# Patient Record
Sex: Female | Born: 1960 | Race: White | Hispanic: No | Marital: Married | Smoking: Former smoker
Health system: Southern US, Community
[De-identification: ages and names within clinical notes are randomized; demographics above are authoritative.]

## PROBLEM LIST (undated history)

## (undated) ENCOUNTER — Ambulatory Visit: Admission: EM | Payer: Self-pay | Source: Home / Self Care

## (undated) DIAGNOSIS — K589 Irritable bowel syndrome without diarrhea: Secondary | ICD-10-CM

## (undated) DIAGNOSIS — M1990 Unspecified osteoarthritis, unspecified site: Secondary | ICD-10-CM

## (undated) DIAGNOSIS — K219 Gastro-esophageal reflux disease without esophagitis: Secondary | ICD-10-CM

## (undated) DIAGNOSIS — Z8614 Personal history of Methicillin resistant Staphylococcus aureus infection: Secondary | ICD-10-CM

## (undated) DIAGNOSIS — F419 Anxiety disorder, unspecified: Secondary | ICD-10-CM

## (undated) DIAGNOSIS — K529 Noninfective gastroenteritis and colitis, unspecified: Secondary | ICD-10-CM

## (undated) DIAGNOSIS — D649 Anemia, unspecified: Secondary | ICD-10-CM

## (undated) DIAGNOSIS — R0600 Dyspnea, unspecified: Secondary | ICD-10-CM

## (undated) DIAGNOSIS — C50919 Malignant neoplasm of unspecified site of unspecified female breast: Secondary | ICD-10-CM

## (undated) DIAGNOSIS — T7840XA Allergy, unspecified, initial encounter: Secondary | ICD-10-CM

## (undated) DIAGNOSIS — R011 Cardiac murmur, unspecified: Secondary | ICD-10-CM

## (undated) DIAGNOSIS — J189 Pneumonia, unspecified organism: Secondary | ICD-10-CM

## (undated) DIAGNOSIS — Z923 Personal history of irradiation: Secondary | ICD-10-CM

## (undated) HISTORY — PX: COLONOSCOPY W/ POLYPECTOMY: SHX1380

## (undated) HISTORY — PX: FOOT SURGERY: SHX648

## (undated) HISTORY — PX: BREAST CYST EXCISION: SHX579

## (undated) HISTORY — PX: CHOLECYSTECTOMY: SHX55

## (undated) HISTORY — PX: COLONOSCOPY WITH ESOPHAGOGASTRODUODENOSCOPY (EGD): SHX5779

---

## 1993-08-31 HISTORY — PX: TUBAL LIGATION: SHX77

## 2001-08-31 DIAGNOSIS — J189 Pneumonia, unspecified organism: Secondary | ICD-10-CM

## 2001-08-31 HISTORY — DX: Pneumonia, unspecified organism: J18.9

## 2004-06-19 ENCOUNTER — Ambulatory Visit: Payer: Self-pay

## 2007-01-13 ENCOUNTER — Ambulatory Visit: Payer: Self-pay

## 2008-01-16 ENCOUNTER — Ambulatory Visit: Payer: Self-pay

## 2008-12-12 ENCOUNTER — Ambulatory Visit: Payer: Self-pay | Admitting: Gastroenterology

## 2009-01-25 ENCOUNTER — Ambulatory Visit: Payer: Self-pay | Admitting: Surgery

## 2009-01-30 ENCOUNTER — Ambulatory Visit: Payer: Self-pay | Admitting: Surgery

## 2010-09-25 ENCOUNTER — Ambulatory Visit: Payer: Self-pay

## 2011-09-01 DIAGNOSIS — Z8614 Personal history of Methicillin resistant Staphylococcus aureus infection: Secondary | ICD-10-CM

## 2011-09-01 HISTORY — DX: Personal history of Methicillin resistant Staphylococcus aureus infection: Z86.14

## 2012-01-12 ENCOUNTER — Ambulatory Visit: Payer: Self-pay

## 2012-08-26 ENCOUNTER — Ambulatory Visit: Payer: Self-pay | Admitting: Internal Medicine

## 2013-01-20 ENCOUNTER — Ambulatory Visit: Payer: Self-pay | Admitting: Internal Medicine

## 2013-10-31 ENCOUNTER — Ambulatory Visit: Payer: Self-pay | Admitting: Family Medicine

## 2014-01-23 ENCOUNTER — Ambulatory Visit: Payer: Self-pay | Admitting: Gastroenterology

## 2014-01-24 LAB — PATHOLOGY REPORT

## 2014-01-30 DIAGNOSIS — K219 Gastro-esophageal reflux disease without esophagitis: Secondary | ICD-10-CM | POA: Insufficient documentation

## 2014-01-30 DIAGNOSIS — J309 Allergic rhinitis, unspecified: Secondary | ICD-10-CM | POA: Insufficient documentation

## 2014-01-30 DIAGNOSIS — K589 Irritable bowel syndrome without diarrhea: Secondary | ICD-10-CM | POA: Insufficient documentation

## 2014-02-27 DIAGNOSIS — D509 Iron deficiency anemia, unspecified: Secondary | ICD-10-CM | POA: Insufficient documentation

## 2015-01-19 ENCOUNTER — Encounter: Payer: Self-pay | Admitting: Gynecology

## 2015-01-19 ENCOUNTER — Ambulatory Visit: Admission: EM | Admit: 2015-01-19 | Discharge: 2015-01-19 | Discharge status: Absent without leave | Payer: Self-pay

## 2015-01-19 ENCOUNTER — Ambulatory Visit
Admission: EM | Admit: 2015-01-19 | Discharge: 2015-01-19 | Disposition: A | Discharge status: Home / Self Care | Payer: Self-pay | Attending: Family Medicine | Admitting: Family Medicine

## 2015-01-19 DIAGNOSIS — S61451A Open bite of right hand, initial encounter: Secondary | ICD-10-CM

## 2015-01-19 DIAGNOSIS — W540XXA Bitten by dog, initial encounter: Principal | ICD-10-CM

## 2015-01-19 HISTORY — DX: Noninfective gastroenteritis and colitis, unspecified: K52.9

## 2015-01-19 HISTORY — DX: Unspecified osteoarthritis, unspecified site: M19.90

## 2015-01-19 HISTORY — DX: Irritable bowel syndrome, unspecified: K58.9

## 2015-01-19 MED ORDER — FLUCONAZOLE 150 MG PO TABS: 150.0000 mg | ORAL_TABLET | Freq: Every day | ORAL | Status: AC

## 2015-01-19 MED ORDER — AMOXICILLIN-POT CLAVULANATE 875-125 MG PO TABS: 1.0000 | ORAL_TABLET | Freq: Two times a day (BID) | ORAL | Status: AC

## 2015-01-19 NOTE — Discharge Instructions (Signed)

## 2015-01-19 NOTE — ED Provider Notes (Signed)
Patient presents today with laceration between the ring finger and middle finger on the right hand. Patient states that about 2 hours ago the patient was playing with her dog when the dog bit her. No acute bleeding going on at this time. Patient states that the dog has been immunized.  Review of systems negative except mentioned above.  Vitals noted in chart.  General: No apparent distress. Skin: Approximately 0.5 inch laceration between the third and fourth digits on the right hand. A small abrasion is also on the dorsal aspect of the hand. No active bleeding. Full range of motion of the hand and digits. Neurovascularly intact.  A/P: Right hand dog bite- area was cleaned thoroughly, dressed and placed, Augmentin for 7 days prescribed, monitor for any signs of infection, seek medical attention if symptoms persist or worsen as discussed. Animal control was called. Patient states that she has had a tetanus shot in the last 10 years.  Paulina Fusi, MD 01/19/15 [phone]

## 2015-01-19 NOTE — ED Notes (Signed)
Patient c/o dog bite x today / 2 hrs ago. Per patient while at home today playing with her son Otilio Saber got bitten between ring finger and middle on right hand. Pt. Also stated dog has lyme disease and is on medication.

## 2015-01-19 NOTE — ED Notes (Signed)
Call [facility] regarding patient dog bite. Spoke with Mickel Baas at [employer]. Per Mickel Baas will notify animal control.

## 2015-08-09 ENCOUNTER — Encounter: Payer: Self-pay | Admitting: *Deleted

## 2015-08-09 ENCOUNTER — Ambulatory Visit
Admission: EM | Admit: 2015-08-09 | Discharge: 2015-08-09 | Disposition: A | Discharge status: Home / Self Care | Payer: Self-pay | Attending: Family Medicine | Admitting: Family Medicine

## 2015-08-09 DIAGNOSIS — J069 Acute upper respiratory infection, unspecified: Secondary | ICD-10-CM

## 2015-08-09 MED ORDER — CEFUROXIME AXETIL 250 MG PO TABS: ORAL_TABLET | ORAL | Status: DC

## 2015-08-09 MED ORDER — FLUTICASONE PROPIONATE 50 MCG/ACT NA SUSP: 2.0000 | Freq: Every day | NASAL | Status: DC

## 2015-08-09 NOTE — ED Provider Notes (Signed)
CSN: [id number]     Arrival date & time 08/09/15  0919 History   First MD Initiated Contact with Patient 08/09/15 1019     Chief Complaint  Patient presents with  . Cough  . Nasal Congestion    color of green  . Sore Throat   (Consider location/radiation/quality/duration/timing/severity/associated sxs/prior Treatment) HPI   This a 54 year old female who presents with symptoms of sore throat and sinus began approximately 5 days ago. States that despite using numerous over-the-counter remedies with her symptoms have progressively gotten worse. She states that she had laryngitis on Tuesday followed by some bloody mucus with coughing or sneezing. Now her main concern is that of headache congestion dizziness and night cough. Has had some chills and she states that at home her fever has been as high as 100.5. She has had "pneumonia" in the past and was concerned this may be a problem now. He is afebrile to present time at 65F with an O2 sat of 98%. Respirations are 18 and pulse of 70 blood pressure 160/88. He sounds very  nasally but has no laryngitis at the present time.  Past Medical History  Diagnosis Date  . IBS (irritable bowel syndrome)   . Arthritis   . Colitis     gastric   Past Surgical History  Procedure Laterality Date  . Tubal ligation  1995  . Foot surgery      left  . Cesarean section     History reviewed. No pertinent family history. Social History  Substance Use Topics  . Smoking status: Current Every Day Smoker -- 1.00 packs/day    Types: Cigarettes  . Smokeless tobacco: Never Used  . Alcohol Use: No   OB History    No data available     Review of Systems  Constitutional: Positive for fever, chills and activity change. Negative for diaphoresis and fatigue.  HENT: Positive for congestion, postnasal drip, rhinorrhea, sinus pressure, sneezing, sore throat and voice change.   Respiratory: Positive for cough.   All other systems reviewed and are  negative.   Allergies  Review of patient's allergies indicates no known allergies.  Home Medications   Prior to Admission medications   Medication Sig Start Date End Date Taking? Authorizing Provider  B Complex Vitamins (B-COMPLEX/B-12) TABS Take by mouth daily.   Yes Historical Provider, MD  Black Cohosh 40 MG CAPS Take 40 mg by mouth daily.   Yes Historical Provider, MD  Calcium Carbonate-Vitamin D (CALCIUM-VITAMIN D) 500-200 MG-UNIT per tablet Take 1 tablet by mouth daily.   Yes Historical Provider, MD  FLUoxetine (PROZAC) 10 MG tablet Take 10 mg by mouth daily.   Yes Historical Provider, MD  naproxen sodium (ANAPROX) 220 MG tablet Take 220 mg by mouth 2 (two) times daily with a meal.   Yes Historical Provider, MD  omeprazole (PRILOSEC) 20 MG capsule Take 20 mg by mouth daily.   Yes Historical Provider, MD  cefUROXime (CEFTIN) 250 MG tablet Take one tablet BID with food 08/16/15   Lorin Picket, PA-C  fluticasone [facility]) 50 MCG/ACT nasal spray Place 2 sprays into both nostrils daily. 08/09/15   Lorin Picket, PA-C   Meds Ordered and Administered this Visit  Medications - No data to display  BP 160/88 mmHg  Pulse 70  Temp(Src) 98 F (36.7 C) (Oral)  Resp 18  Ht 5\' 2"  (1.575 m)  Wt 250 lb (113.399 kg)  BMI 45.71 kg/m2  SpO2 98% No data found.   Physical  Exam  Constitutional: She is oriented to person, place, and time. She appears well-developed and well-nourished. No distress.  HENT:  Head: Normocephalic and atraumatic.  Right Ear: External ear normal.  Left Ear: External ear normal.  Nose: Nose normal.  Mouth/Throat: Oropharynx is clear and moist.  Eyes: Conjunctivae are normal. Pupils are equal, round, and reactive to light.  Neck: Normal range of motion. Neck supple.  Pulmonary/Chest: Effort normal and breath sounds normal. No respiratory distress. She has no wheezes. She has no rales.  Musculoskeletal: Normal range of motion. She exhibits no edema or  tenderness.  Lymphadenopathy:    She has no cervical adenopathy.  Neurological: She is alert and oriented to person, place, and time.  Skin: Skin is warm and dry. No rash noted. She is not diaphoretic. No erythema.  Psychiatric: Her behavior is normal. Judgment and thought content normal.  Nursing note and vitals reviewed.   ED Course  Procedures (including critical care time)  Labs Review Labs Reviewed - No data to display  Imaging Review No results found.   Visual Acuity Review  Right Eye Distance:   Left Eye Distance:   Bilateral Distance:    Right Eye Near:   Left Eye Near:    Bilateral Near:         MDM   1. URI, acute    Discharge Medication List as of 08/09/2015 10:43 AM    START taking these medications   Details  cefUROXime (CEFTIN) 250 MG tablet Take one tablet BID with food, Print    fluticasone (FLONASE) 50 MCG/ACT nasal spray Place 2 sprays into both nostrils daily., Starting 08/09/2015, Until Discontinued, Print      Plan: 1. Diagnosis reviewed with patient 2. rx as per orders; risks, benefits, potential side effects reviewed with patient 3. Recommend supportive treatment with fluids rest. Flonase for drainage. Antibiotics in 1 week if not improving. 4. F/u prn if symptoms worsen or don't improve     Lorin Picket, PA-C 08/09/15 1056

## 2015-08-09 NOTE — Discharge Instructions (Signed)
Cool Mist Vaporizers °Vaporizers may help relieve the symptoms of a cough and cold. They add moisture to the air, which helps mucus to become thinner and less sticky. This makes it easier to breathe and cough up secretions. Cool mist vaporizers do not cause serious burns like hot mist vaporizers, which may also be called steamers or humidifiers. Vaporizers have not been proven to help with colds. You should not use a vaporizer if you are allergic to mold. °HOME CARE INSTRUCTIONS °· Follow the package instructions for the vaporizer. °· Do not use anything other than distilled water in the vaporizer. °· Do not run the vaporizer all of the time. This can cause mold or bacteria to grow in the vaporizer. °· Clean the vaporizer after each time it is used. °· Clean and dry the vaporizer well before storing it. °· Stop using the vaporizer if worsening respiratory symptoms develop. °  °This information is not intended to replace advice given to you by your health care provider. Make sure you discuss any questions you have with your health care provider. °  °Document Released: 05/14/2004 Document Revised: 08/22/2013 Document Reviewed: 01/04/2013 °Elsevier Interactive Patient Education ©2016 Elsevier Inc. ° °Upper Respiratory Infection, Adult °Most upper respiratory infections (URIs) are a viral infection of the air passages leading to the lungs. A URI affects the nose, throat, and upper air passages. The most common type of URI is nasopharyngitis and is typically referred to as "the common cold." °URIs run their course and usually go away on their own. Most of the time, a URI does not require medical attention, but sometimes a bacterial infection in the upper airways can follow a viral infection. This is called a secondary infection. Sinus and middle ear infections are common types of secondary upper respiratory infections. °Bacterial pneumonia can also complicate a URI. A URI can worsen asthma and chronic obstructive  pulmonary disease (COPD). Sometimes, these complications can require emergency medical care and may be life threatening.  °CAUSES °Almost all URIs are caused by viruses. A virus is a type of germ and can spread from one person to another.  °RISKS FACTORS °You may be at risk for a URI if:  °· You smoke.   °· You have chronic heart or lung disease. °· You have a weakened defense (immune) system.   °· You are very young or very old.   °· You have nasal allergies or asthma. °· You work in crowded or poorly ventilated areas. °· You work in health care facilities or schools. °SIGNS AND SYMPTOMS  °Symptoms typically develop 2-3 days after you come in contact with a cold virus. Most viral URIs last 7-10 days. However, viral URIs from the influenza virus (flu virus) can last 14-18 days and are typically more severe. Symptoms may include:  °· Runny or stuffy (congested) nose.   °· Sneezing.   °· Cough.   °· Sore throat.   °· Headache.   °· Fatigue.   °· Fever.   °· Loss of appetite.   °· Pain in your forehead, behind your eyes, and over your cheekbones (sinus pain). °· Muscle aches.   °DIAGNOSIS  °Your health care provider may diagnose a URI by: °· Physical exam. °· Tests to check that your symptoms are not due to another condition such as: °¨ Strep throat. °¨ Sinusitis. °¨ Pneumonia. °¨ Asthma. °TREATMENT  °A URI goes away on its own with time. It cannot be cured with medicines, but medicines may be prescribed or recommended to relieve symptoms. Medicines may help: °· Reduce your fever. °· Reduce   your cough. °· Relieve nasal congestion. °HOME CARE INSTRUCTIONS  °· Take medicines only as directed by your health care provider.   °· Gargle warm saltwater or take cough drops to comfort your throat as directed by your health care provider. °· Use a warm mist humidifier or inhale steam from a shower to increase air moisture. This may make it easier to breathe. °· Drink enough fluid to keep your urine clear or pale yellow.   °· Eat  soups and other clear broths and maintain good nutrition.   °· Rest as needed.   °· Return to work when your temperature has returned to normal or as your health care provider advises. You may need to stay home longer to avoid infecting others. You can also use a face mask and careful hand washing to prevent spread of the virus. °· Increase the usage of your inhaler if you have asthma.   °· Do not use any tobacco products, including cigarettes, chewing tobacco, or electronic cigarettes. If you need help quitting, ask your health care provider. °PREVENTION  °The best way to protect yourself from getting a cold is to practice good hygiene.  °· Avoid oral or hand contact with people with cold symptoms.   °· Wash your hands often if contact occurs.   °There is no clear evidence that vitamin C, vitamin E, echinacea, or exercise reduces the chance of developing a cold. However, it is always recommended to get plenty of rest, exercise, and practice good nutrition.  °SEEK MEDICAL CARE IF:  °· You are getting worse rather than better.   °· Your symptoms are not controlled by medicine.   °· You have chills. °· You have worsening shortness of breath. °· You have brown or red mucus. °· You have yellow or brown nasal discharge. °· You have pain in your face, especially when you bend forward. °· You have a fever. °· You have swollen neck glands. °· You have pain while swallowing. °· You have white areas in the back of your throat. °SEEK IMMEDIATE MEDICAL CARE IF:  °· You have severe or persistent: °¨ Headache. °¨ Ear pain. °¨ Sinus pain. °¨ Chest pain. °· You have chronic lung disease and any of the following: °¨ Wheezing. °¨ Prolonged cough. °¨ Coughing up blood. °¨ A change in your usual mucus. °· You have a stiff neck. °· You have changes in your: °¨ Vision. °¨ Hearing. °¨ Thinking. °¨ Mood. °MAKE SURE YOU:  °· Understand these instructions. °· Will watch your condition. °· Will get help right away if you are not doing well or  get worse. °  °This information is not intended to replace advice given to you by your health care provider. Make sure you discuss any questions you have with your health care provider. °  °Document Released: 02/10/2001 Document Revised: 01/01/2015 Document Reviewed: 11/22/2013 °Elsevier Interactive Patient Education ©2016 Elsevier Inc. ° °

## 2015-08-09 NOTE — ED Notes (Signed)
Patient started having symptoms of sore throat and sinus this past Sunday. Symptoms have progressively gotten worse and mucus is green in color. Patient does have a history of pneumonia.

## 2016-08-31 DIAGNOSIS — C50919 Malignant neoplasm of unspecified site of unspecified female breast: Secondary | ICD-10-CM

## 2016-08-31 DIAGNOSIS — Z923 Personal history of irradiation: Secondary | ICD-10-CM

## 2016-08-31 HISTORY — DX: Personal history of irradiation: Z92.3

## 2016-08-31 HISTORY — DX: Malignant neoplasm of unspecified site of unspecified female breast: C50.919

## 2016-12-21 ENCOUNTER — Ambulatory Visit: Payer: Self-pay

## 2017-01-06 ENCOUNTER — Ambulatory Visit: Payer: Self-pay | Attending: Oncology

## 2017-02-17 ENCOUNTER — Ambulatory Visit
Admission: RE | Admit: 2017-02-17 | Discharge: 2017-02-17 | Disposition: A | Discharge status: Home / Self Care | Payer: Self-pay | Source: Ambulatory Visit | Attending: Oncology | Admitting: Oncology

## 2017-02-17 ENCOUNTER — Ambulatory Visit: Payer: Self-pay | Attending: Oncology

## 2017-02-17 VITALS — BP 148/83 | HR 73 | Temp 98.8°F | Resp 16 | Ht 61.0 in | Wt 240.0 lb

## 2017-02-17 DIAGNOSIS — Z0000 Encounter for general adult medical examination without abnormal findings: Secondary | ICD-10-CM

## 2017-02-17 NOTE — Progress Notes (Signed)
Smoking cessation literature given to patient.

## 2017-02-17 NOTE — Progress Notes (Signed)
Subjective:     Patient ID: Robin Brooks, female   DOB: 06-08-1961, 56 y.o.   MRN: [id number]  HPI   Review of Systems     Objective:   Physical Exam  Pulmonary/Chest: Right breast exhibits no inverted nipple, no mass, no nipple discharge, no skin change and no tenderness. Left breast exhibits no inverted nipple, no mass, no nipple discharge, no skin change and no tenderness. Breasts are asymmetrical.  Right breast larger than left       Assessment:     56 year old female patient presents for [place] clinic visit Patient screened, and meets BCC[place] eligibility.  Patient does not have insurance, Medicare or Medicaid.  Handout given on Affordable Care Act. Instructed patient on breast self-exam using teach back method. CBE unremarkable.  No mass or lump palpated.  Patient had first granddaughter born prematurely 53 month ago at [facility].  She is doing well now.    Plan:  Sent for bilateral screening mammogram.

## 2017-02-18 ENCOUNTER — Other Ambulatory Visit: Payer: Self-pay

## 2017-02-18 DIAGNOSIS — R920 Mammographic microcalcification found on diagnostic imaging of breast: Secondary | ICD-10-CM

## 2017-02-24 ENCOUNTER — Other Ambulatory Visit: Payer: Self-pay

## 2017-02-24 ENCOUNTER — Ambulatory Visit
Admission: RE | Admit: 2017-02-24 | Discharge: 2017-02-24 | Disposition: A | Discharge status: Home / Self Care | Payer: Self-pay | Source: Ambulatory Visit | Attending: Oncology | Admitting: Oncology

## 2017-02-24 DIAGNOSIS — R920 Mammographic microcalcification found on diagnostic imaging of breast: Secondary | ICD-10-CM

## 2017-03-02 ENCOUNTER — Ambulatory Visit
Admission: RE | Admit: 2017-03-02 | Discharge: 2017-03-02 | Disposition: A | Discharge status: Home / Self Care | Payer: Self-pay | Source: Ambulatory Visit | Attending: Oncology | Admitting: Oncology

## 2017-03-02 DIAGNOSIS — R920 Mammographic microcalcification found on diagnostic imaging of breast: Secondary | ICD-10-CM

## 2017-03-02 HISTORY — PX: BREAST BIOPSY: SHX20

## 2017-03-04 LAB — SURGICAL PATHOLOGY

## 2017-03-05 NOTE — Progress Notes (Signed)
  Oncology Nurse Navigator Documentation  Navigator Location: [facility] (03/05/17 1100)   )Navigator Encounter Type: Introductory phone call (03/05/17 1100)   Abnormal Finding Date: 02/24/17 (03/05/17 1100) Confirmed Diagnosis Date: 03/02/17 (03/05/17 1100)                   Barriers/Navigation Needs: Coordination of Care;Education (03/05/17 1100) Education: Accessing Care/ Finding Providers;Understanding Cancer/ Treatment Options;Coping with Diagnosis/ Prognosis;Newly Diagnosed Cancer Education (03/05/17 1100) Interventions: Referrals;Coordination of Care (03/05/17 1100)   Coordination of Care: Appts (03/05/17 1100)                  Time Spent with Patient: 30 (03/05/17 1100)   Notified patient of DCIS biopsy results. Scheduled to see Dr. Bary Castilla 03/10/17 at 4;30. To fill out [facility] paperwork on 03/11/17 at 11:00.

## 2017-03-10 ENCOUNTER — Ambulatory Visit (INDEPENDENT_AMBULATORY_CARE_PROVIDER_SITE_OTHER): Payer: PRIVATE HEALTH INSURANCE | Admitting: General Surgery

## 2017-03-10 ENCOUNTER — Encounter: Payer: Self-pay | Admitting: General Surgery

## 2017-03-10 ENCOUNTER — Inpatient Hospital Stay: Payer: Self-pay

## 2017-03-10 VITALS — BP 160/82 | HR 80 | Resp 14 | Ht 64.0 in | Wt 241.0 lb

## 2017-03-10 DIAGNOSIS — D0512 Intraductal carcinoma in situ of left breast: Secondary | ICD-10-CM | POA: Diagnosis not present

## 2017-03-10 DIAGNOSIS — N6320 Unspecified lump in the left breast, unspecified quadrant: Secondary | ICD-10-CM

## 2017-03-10 NOTE — Progress Notes (Signed)
Patient ID: Robin Brooks, female   DOB: 08-03-1961, 56 y.o.   MRN: [id number]  Chief Complaint  Patient presents with  . Other    HPI Robin Brooks is a 56 y.o. female who presents for a breast evaluation. The most recent mammogram was done on 02/17/2017 added 02/24/2017 and left breast biopsy on 03/02/2017.  Patient does perform regular self breast checks and gets regular mammograms done.  Husband, Robin Reichmann (Will) is present.   HPI  Past Medical History:  Diagnosis Date  . Arthritis   . Colitis    gastric  . 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 IBS (irritable bowel syndrome)     Past Surgical History:  Procedure Laterality Date  . BREAST BIOPSY Left 03/02/2017   left breast stereo calcs  . BREAST CYST EXCISION Left    neg  . CESAREAN SECTION    . CHOLECYSTECTOMY    . FOOT SURGERY     left  . TUBAL LIGATION  1995    Family History  Problem Relation Age of Onset  . Breast cancer Neg Hx     Social History Social History  Substance Use Topics  . Smoking status: Current Every Day Smoker    Packs/day: 0.25    Types: Cigarettes  . Smokeless tobacco: Never Used  . Alcohol use No    No Known Allergies  Current Outpatient Prescriptions  Medication Sig Dispense Refill  . B Complex Vitamins (B-COMPLEX/B-12) TABS Take by mouth daily.    . Calcium Carbonate-Vitamin D (CALCIUM-VITAMIN D) 500-200 MG-UNIT per tablet Take 1 tablet by mouth daily.    . cefUROXime (CEFTIN) 250 MG tablet Take one tablet BID with food 20 tablet 0  . FLUoxetine (PROZAC) 10 MG tablet Take 10 mg by mouth daily.    . fluticasone (FLONASE) 50 MCG/ACT nasal spray Place 2 sprays into both nostrils daily. 16 g 0  . naproxen sodium (ANAPROX) 220 MG tablet Take 220 mg by mouth 2 (two) times daily with a meal.    . omeprazole (PRILOSEC) 20 MG capsule Take 20 mg by mouth daily.     No current facility-administered medications for this visit.     Review of Systems Review of Systems   Constitutional: Negative.   Respiratory: Negative.   Cardiovascular: Negative.     Blood pressure (!) 160/82, pulse 80, resp. rate 14, height 5\' 4"  (1.626 m), weight 241 lb (109.3 kg), last menstrual period 06/20/2015.  Physical Exam Physical Exam  Constitutional: She is oriented to person, place, and time. She appears well-developed and well-nourished.  Eyes: Conjunctivae are normal. No scleral icterus.  Neck: Neck supple.  Cardiovascular: Normal rate, regular rhythm and normal heart sounds.   Pulmonary/Chest: Effort normal and breath sounds normal. Right breast exhibits no inverted nipple, no mass, no nipple discharge, no skin change and no tenderness. Left breast exhibits no inverted nipple, no mass, no nipple discharge, no skin change and no tenderness.  Abdominal: Soft. Bowel sounds are normal. There is no tenderness.  Lymphadenopathy:    She has no cervical adenopathy.    She has no axillary adenopathy.  Neurological: She is alert and oriented to person, place, and time.  Skin: Skin is warm and dry.    Data Reviewed  DIAGNOSIS:  A. 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   Comment:  DCIS is in multiple core fragments  and spans up to 4 mm. Testing for  estrogen and progesterone receptors is deferred to the excision  specimen.   Ultrasound completed to access if preoperative needle localization will be needed.  1.0 x 1.22 x 1.66 biopsy cavity evident at the 5 o'clock position 9 CFN, 3 cm above the infra mammary fold.    Assessment    Left DCIS, T0.     Plan    The patient is an excellent candidate for wide excision and post operative radiation therapy. Benefit of anti-estrogen therapy will be unknown until ER/ PR testing done.     The better part of an hour was spent reviewing options and indication for additional surgery.  The patient  may choose to defer surgery until August, which is fine.  A brief preop visit will be required to confirm the biopsy site is still visible on ultrasound.   HPI, Physical Exam, Assessment and Plan have been scribed under the direction and in the presence of Hervey Ard, MD.  Gaspar Cola, CMA  I have completed the exam and reviewed the above documentation for accuracy and completeness.  I agree with the above.  Haematologist has been used and any errors in dictation or transcription are unintentional.  Hervey Ard, M.D., F.A.C.S.  Robin Brooks 03/11/2017, 9:57 PM

## 2017-03-11 ENCOUNTER — Encounter: Payer: Self-pay | Admitting: General Surgery

## 2017-03-11 DIAGNOSIS — D0512 Intraductal carcinoma in situ of left breast: Secondary | ICD-10-CM | POA: Insufficient documentation

## 2017-03-11 NOTE — Progress Notes (Signed)
Patient came in to fill out BCCCP Medicaid forms. Given Breast Cancer Treatment Handbook/folder with hospital info.  Patient is planning to call Dr. Dwyane Luo office to schedule surgery.  Oncology Nurse Navigator Documentation  Navigator Location: [facility] (03/11/17 1600)   )Navigator Encounter Type: Education;Clinic/MDC (03/11/17 1600)                         Barriers/Navigation Needs: Financial;Education (03/11/17 1600)   Interventions: Education;Coordination of Care [facility]) (03/11/17 1600)                      Time Spent with Patient: 60 (03/11/17 1600)

## 2017-03-15 ENCOUNTER — Other Ambulatory Visit: Payer: Self-pay

## 2017-03-16 NOTE — Progress Notes (Signed)
Copy to [facility].

## 2017-03-26 ENCOUNTER — Telehealth: Payer: Self-pay

## 2017-03-26 NOTE — Telephone Encounter (Signed)
Patient called to see about scheduling her breast surgery. The patient would like to have surgery on 04/19/17. She will pre admit by phone. She will have a short pre op visit with Dr Bary Castilla on 04/12/17 at 10:45 am. The patient is aware of dates, time, and instructions.

## 2017-03-29 ENCOUNTER — Other Ambulatory Visit: Payer: Self-pay | Admitting: *Deleted

## 2017-03-29 ENCOUNTER — Other Ambulatory Visit: Payer: Self-pay | Admitting: General Surgery

## 2017-03-29 DIAGNOSIS — D0512 Intraductal carcinoma in situ of left breast: Secondary | ICD-10-CM

## 2017-04-09 ENCOUNTER — Encounter
Admission: RE | Admit: 2017-04-09 | Discharge: 2017-04-09 | Disposition: A | Discharge status: Home / Self Care | Payer: Medicaid Other | Source: Ambulatory Visit | Attending: General Surgery | Admitting: General Surgery

## 2017-04-09 HISTORY — DX: Gastro-esophageal reflux disease without esophagitis: K21.9

## 2017-04-09 HISTORY — DX: Cardiac murmur, unspecified: R01.1

## 2017-04-09 HISTORY — DX: Personal history of Methicillin resistant Staphylococcus aureus infection: Z86.14

## 2017-04-09 HISTORY — DX: Anemia, unspecified: D64.9

## 2017-04-09 HISTORY — DX: Pneumonia, unspecified organism: J18.9

## 2017-04-09 NOTE — Patient Instructions (Addendum)
  Your procedure is scheduled on: 04-19-17 MONDAY Report to [facility] @ 7:45 AM   Remember: Instructions that are not followed completely may result in serious medical risk, up to and including death, or upon the discretion of your surgeon and anesthesiologist your surgery may need to be rescheduled.    _x___ 1. Do not eat food or drink liquids after midnight. No gum chewing or  hard candies.     __x__ 2. No Alcohol for 24 hours before or after surgery.   __x__3. No Smoking for 24 prior to surgery.   ____  4. Bring all medications with you on the day of surgery if instructed.    __x__ 5. Notify your doctor if there is any change in your medical condition     (cold, fever, infections).     Do not wear jewelry, make-up, hairpins, clips or nail polish.  Do not wear lotions, powders, or perfumes. You may wear deodorant.  Do not shave 48 hours prior to surgery. Men may shave face and neck.  Do not bring valuables to the hospital.    [facility] is not responsible for any belongings or valuables.               Contacts, dentures or bridgework may not be worn into surgery.  Leave your suitcase in the car. After surgery it may be brought to your room.  For patients admitted to the hospital, discharge time is determined by your treatment team.   Patients discharged the day of surgery will not be allowed to drive home.  You will need someone to drive you home and stay with you the night of your procedure.    Please read over the following fact sheets that you were given:   [facility] Preparing for Surgery and or MRSA Information   _x___ TAKE THE FOLLOWING MEDICATION THE MORNING OF SURGERY WITH A SMALL SIP OF WATER. These include:  1. PROZAC (FLUOXETINE)  2.PRILOSEC (OMEPRAZOLE)  3.TAKE A PRILOSEC ON Sunday NIGHT BEFORE BED (04-18-17)  4.  5.  6.  ____Fleets enema or Magnesium Citrate as directed.   _x___ Use CHG Soap or sage wipes as directed on instruction sheet   ____  Use inhalers on the day of surgery and bring to hospital day of surgery  ____ Stop Metformin and Janumet 2 days prior to surgery.    ____ Take 1/2 of usual insulin dose the night before surgery and none on the morning surgery.   ____ Follow recommendations from Cardiologist, Pulmonologist or PCP regarding stopping Aspirin, Coumadin, Pllavix ,Eliquis, Effient, or Pradaxa, and Pletal.  ____Stop Anti-inflammatories such as Advil, Aleve, Ibuprofen, Motrin, Naproxen, Naprosyn, Goodies powders or aspirin products. OK to take Tylenol    _x___ Stop supplements until after surgery-STOP OTC I-COOL FOR MENOPUASE NOW-MAY RESUME AFTER SURGERY   ____ Bring C-Pap to the hospital.

## 2017-04-12 ENCOUNTER — Ambulatory Visit: Payer: PRIVATE HEALTH INSURANCE | Admitting: General Surgery

## 2017-04-12 ENCOUNTER — Inpatient Hospital Stay: Admission: RE | Admit: 2017-04-12 | Payer: Self-pay | Source: Ambulatory Visit

## 2017-04-14 ENCOUNTER — Ambulatory Visit (INDEPENDENT_AMBULATORY_CARE_PROVIDER_SITE_OTHER): Payer: Medicaid Other | Admitting: General Surgery

## 2017-04-14 ENCOUNTER — Inpatient Hospital Stay: Payer: Self-pay

## 2017-04-14 ENCOUNTER — Encounter: Payer: Self-pay | Admitting: General Surgery

## 2017-04-14 ENCOUNTER — Telehealth: Payer: Self-pay

## 2017-04-14 ENCOUNTER — Encounter
Admission: RE | Admit: 2017-04-14 | Discharge: 2017-04-14 | Disposition: A | Discharge status: Home / Self Care | Payer: Medicaid Other | Source: Ambulatory Visit | Attending: General Surgery | Admitting: General Surgery

## 2017-04-14 VITALS — BP 134/70 | HR 74 | Resp 12 | Ht 62.0 in | Wt 241.0 lb

## 2017-04-14 DIAGNOSIS — D0512 Intraductal carcinoma in situ of left breast: Secondary | ICD-10-CM | POA: Insufficient documentation

## 2017-04-14 DIAGNOSIS — Z01812 Encounter for preprocedural laboratory examination: Secondary | ICD-10-CM | POA: Insufficient documentation

## 2017-04-14 LAB — SURGICAL PCR SCREEN
MRSA, PCR: NEGATIVE
Staphylococcus aureus: POSITIVE — AB

## 2017-04-14 LAB — HEMOGLOBIN: HEMOGLOBIN: 13.1 g/dL (ref 12.0–16.0)

## 2017-04-14 MED ORDER — MUPIROCIN 2 % EX OINT: 1.0000 "application " | TOPICAL_OINTMENT | Freq: Two times a day (BID) | CUTANEOUS | 0 refills | Status: DC

## 2017-04-14 NOTE — Telephone Encounter (Signed)
-----   Message from Robert Bellow, MD sent at 04/14/2017  2:45 PM EDT ----- Please notify the patient that today's screening test at the hospital showed that she has staphylococci in her nose. Not MRSA. I sent a prescription for Bactroban ointment to her pharmacy. I would like her to put a small amount in both nostrils twice a day between now and surgery. This will lower any chance for infection. Thank you ----- Message ----- From: Buel Ream, [facility] [place] Sent: 04/14/2017  12:26 PM To: Robert Bellow, MD

## 2017-04-14 NOTE — Patient Instructions (Addendum)
Patient is scheduled for surgery on 04/19/2017. The patient is aware to call back for any questions or concerns.

## 2017-04-14 NOTE — Progress Notes (Signed)
Patient ID: Robin Brooks, female   DOB: 1961/06/26, 56 y.o.   MRN: [id number]  Chief Complaint  Patient presents with  . Pre-op Exam    HPI Robin Brooks is a 56 y.o. female.  here for pre op and left breast ultrasound.The patient reports no change in her general health.  HPI  Past Medical History:  Diagnosis Date  . Anemia   . Arthritis   . Colitis    gastric  . 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 GERD (gastroesophageal reflux disease)   . Heart murmur    ASYMPTOMATIC  . History of methicillin resistant staphylococcus aureus (MRSA) 2013  . IBS (irritable bowel syndrome)   . Pneumonia 2003   H/O    Past Surgical History:  Procedure Laterality Date  . BREAST BIOPSY Left 03/02/2017   left breast stereo calcs  . BREAST CYST EXCISION Left    neg  . CESAREAN SECTION    . CHOLECYSTECTOMY    . FOOT SURGERY     left  . TUBAL LIGATION  1995    Family History  Problem Relation Age of Onset  . Breast cancer Neg Hx     Social History Social History  Substance Use Topics  . Smoking status: Current Every Day Smoker    Packs/day: 0.25    Years: 35.00    Types: Cigarettes  . Smokeless tobacco: Never Used  . Alcohol use No    No Known Allergies  Current Outpatient Prescriptions  Medication Sig Dispense Refill  . cefUROXime (CEFTIN) 250 MG tablet Take one tablet BID with food 20 tablet 0  . FLUoxetine (PROZAC) 20 MG capsule Take 20 mg by mouth daily before breakfast.    . fluticasone (FLONASE) 50 MCG/ACT nasal spray Place 2 sprays into both nostrils daily. 16 g 0  . Genistein (I-COOL FOR MENOPAUSE PO) Take 1 tablet by mouth daily.    . Loperamide HCl (IMODIUM A-D PO) Take 1 tablet by mouth as needed.    . Multiple Vitamin (MULTIVITAMIN WITH MINERALS) TABS tablet Take 1 tablet by mouth daily. Women's 50+    . naproxen sodium (ANAPROX) 220 MG tablet Take 220 mg by mouth daily.     Marland Kitchen omeprazole (PRILOSEC) 20 MG capsule Take 20 mg by mouth  daily before breakfast.     . OVER THE COUNTER MEDICATION Take 1 tablet by mouth daily as needed (allergies). [employer] brand allergy relief medication    . mupirocin ointment (BACTROBAN) 2 % Place 1 application into the nose 2 (two) times daily. 22 g 0   No current facility-administered medications for this visit.     Review of Systems Review of Systems  Blood pressure 134/70, pulse 74, resp. rate 12, height 5\' 2"  (1.575 m), weight 241 lb (109.3 kg), last menstrual period 06/20/2015.  Physical Exam Physical Exam  Constitutional: She is oriented to person, place, and time. She appears well-developed and well-nourished.  Cardiovascular: Normal rate, regular rhythm and normal heart sounds.   Pulmonary/Chest: Effort normal and breath sounds normal.  Neurological: She is alert and oriented to person, place, and time.  Skin: Skin is warm.    Data Reviewed Ultrasound examination of the left breast at the 5:00 position 9 cm from nipple, 3 cm above the inframammary fold shows a residual biopsy cavity approximately 1.08 x 1.3 cm in diameter. This is about 1.1 cm below the skin. BI-RADS-6.  MRSA, PCR NEGATIVE NEGATIVE   Staphylococcus aureus NEGATIVE  POSITIVE      Assessment    Left breast DCIS, candidate for breast conservation. Staph aureus without MRSA on preoperative PCR screen.  Past history MRSA.    Plan    As the biopsy cavity remains evident on ultrasound, needle localization will not be required.  Patient is scheduled for surgery on 04/19/2017. The patient is aware to call back for any questions or concerns.  The patient will be placed on Bactroban intranasally twice a day between now and surgery.  In light of her staph screen will give preoperative Kefzol.  HPI, Physical Exam, Assessment and Plan have been scribed under the direction and in the presence of Hervey Ard, MD.  Gaspar Cola, CMA  I have completed the exam and reviewed the above documentation  for accuracy and completeness.  I agree with the above.  Haematologist has been used and any errors in dictation or transcription are unintentional.  Hervey Ard, M.D., F.A.C.S.  Robert Bellow 04/14/2017, 8:12 PM

## 2017-04-15 NOTE — Telephone Encounter (Signed)
Notified patient as instructed, patient pleased. Will start prescription today.

## 2017-04-18 MED ORDER — CEFAZOLIN SODIUM-DEXTROSE 2-4 GM/100ML-% IV SOLN
2.0000 g | INTRAVENOUS | Status: AC
  Administered 2017-04-19: 2 g via INTRAVENOUS

## 2017-04-19 ENCOUNTER — Ambulatory Visit: Payer: Medicaid Other | Admitting: Anesthesiology

## 2017-04-19 ENCOUNTER — Ambulatory Visit: Payer: Medicaid Other

## 2017-04-19 ENCOUNTER — Ambulatory Visit
Admission: RE | Admit: 2017-04-19 | Discharge: 2017-04-19 | Disposition: A | Discharge status: Home / Self Care | Payer: Medicaid Other | Source: Ambulatory Visit | Attending: General Surgery | Admitting: General Surgery

## 2017-04-19 ENCOUNTER — Encounter: Payer: Self-pay | Admitting: *Deleted

## 2017-04-19 ENCOUNTER — Encounter
Admission: RE | Disposition: A | Discharge status: Home / Self Care | Payer: Self-pay | Source: Ambulatory Visit | Attending: General Surgery

## 2017-04-19 DIAGNOSIS — K219 Gastro-esophageal reflux disease without esophagitis: Secondary | ICD-10-CM | POA: Diagnosis not present

## 2017-04-19 DIAGNOSIS — M1990 Unspecified osteoarthritis, unspecified site: Secondary | ICD-10-CM | POA: Insufficient documentation

## 2017-04-19 DIAGNOSIS — D649 Anemia, unspecified: Secondary | ICD-10-CM | POA: Diagnosis not present

## 2017-04-19 DIAGNOSIS — E669 Obesity, unspecified: Secondary | ICD-10-CM | POA: Diagnosis not present

## 2017-04-19 DIAGNOSIS — D0512 Intraductal carcinoma in situ of left breast: Secondary | ICD-10-CM

## 2017-04-19 DIAGNOSIS — Z6841 Body Mass Index (BMI) 40.0 and over, adult: Secondary | ICD-10-CM | POA: Insufficient documentation

## 2017-04-19 DIAGNOSIS — F17200 Nicotine dependence, unspecified, uncomplicated: Secondary | ICD-10-CM | POA: Insufficient documentation

## 2017-04-19 DIAGNOSIS — C50512 Malignant neoplasm of lower-outer quadrant of left female breast: Secondary | ICD-10-CM | POA: Diagnosis not present

## 2017-04-19 HISTORY — PX: BREAST LUMPECTOMY: SHX2

## 2017-04-19 SURGERY — BREAST LUMPECTOMY
Anesthesia: General | Laterality: Left | Wound class: Clean

## 2017-04-19 MED ORDER — MIDAZOLAM HCL 2 MG/2ML IJ SOLN
INTRAMUSCULAR | Status: DC | PRN
  Administered 2017-04-19 (×2): 1 mg via INTRAVENOUS

## 2017-04-19 MED ORDER — CEFAZOLIN SODIUM-DEXTROSE 2-4 GM/100ML-% IV SOLN
INTRAVENOUS | Status: AC
  Filled 2017-04-19: qty 100

## 2017-04-19 MED ORDER — KETOROLAC TROMETHAMINE 30 MG/ML IJ SOLN
INTRAMUSCULAR | Status: AC
  Filled 2017-04-19: qty 1

## 2017-04-19 MED ORDER — GABAPENTIN 300 MG PO CAPS
300.0000 mg | ORAL_CAPSULE | ORAL | Status: AC
  Administered 2017-04-19: 300 mg via ORAL

## 2017-04-19 MED ORDER — LACTATED RINGERS IV SOLN
INTRAVENOUS | Status: DC
  Administered 2017-04-19: 07:00:00 via INTRAVENOUS

## 2017-04-19 MED ORDER — FENTANYL CITRATE (PF) 100 MCG/2ML IJ SOLN
25.0000 ug | INTRAMUSCULAR | Status: DC | PRN
  Administered 2017-04-19: 25 ug via INTRAVENOUS

## 2017-04-19 MED ORDER — KETOROLAC TROMETHAMINE 30 MG/ML IJ SOLN
INTRAMUSCULAR | Status: DC | PRN
  Administered 2017-04-19: 30 mg via INTRAVENOUS

## 2017-04-19 MED ORDER — ONDANSETRON HCL 4 MG/2ML IJ SOLN
INTRAMUSCULAR | Status: DC | PRN
  Administered 2017-04-19: 4 mg via INTRAVENOUS

## 2017-04-19 MED ORDER — SEVOFLURANE IN SOLN
RESPIRATORY_TRACT | Status: AC
  Filled 2017-04-19: qty 250

## 2017-04-19 MED ORDER — GLYCOPYRROLATE 0.2 MG/ML IJ SOLN
INTRAMUSCULAR | Status: DC | PRN
  Administered 2017-04-19: 0.2 mg via INTRAVENOUS

## 2017-04-19 MED ORDER — PHENYLEPHRINE HCL 10 MG/ML IJ SOLN
INTRAMUSCULAR | Status: DC | PRN
  Administered 2017-04-19 (×2): 200 ug via INTRAVENOUS
  Administered 2017-04-19: 100 ug via INTRAVENOUS

## 2017-04-19 MED ORDER — FENTANYL CITRATE (PF) 100 MCG/2ML IJ SOLN
INTRAMUSCULAR | Status: DC | PRN
  Administered 2017-04-19 (×2): 50 ug via INTRAVENOUS

## 2017-04-19 MED ORDER — DEXAMETHASONE SODIUM PHOSPHATE 10 MG/ML IJ SOLN
INTRAMUSCULAR | Status: DC | PRN
  Administered 2017-04-19: 10 mg via INTRAVENOUS

## 2017-04-19 MED ORDER — MIDAZOLAM HCL 2 MG/2ML IJ SOLN
INTRAMUSCULAR | Status: AC
  Filled 2017-04-19: qty 2

## 2017-04-19 MED ORDER — ACETAMINOPHEN 10 MG/ML IV SOLN
INTRAVENOUS | Status: AC
  Filled 2017-04-19: qty 100

## 2017-04-19 MED ORDER — BUPIVACAINE-EPINEPHRINE (PF) 0.5% -1:200000 IJ SOLN
INTRAMUSCULAR | Status: AC
  Filled 2017-04-19: qty 30

## 2017-04-19 MED ORDER — PROPOFOL 10 MG/ML IV BOLUS
INTRAVENOUS | Status: AC
  Filled 2017-04-19: qty 20

## 2017-04-19 MED ORDER — HYDROCODONE-ACETAMINOPHEN 5-325 MG PO TABS: 1.0000 | ORAL_TABLET | ORAL | 0 refills | Status: DC | PRN

## 2017-04-19 MED ORDER — EPHEDRINE SULFATE 50 MG/ML IJ SOLN
INTRAMUSCULAR | Status: AC
  Filled 2017-04-19: qty 1

## 2017-04-19 MED ORDER — ACETAMINOPHEN 10 MG/ML IV SOLN
INTRAVENOUS | Status: DC | PRN
  Administered 2017-04-19: 1000 mg via INTRAVENOUS

## 2017-04-19 MED ORDER — ONDANSETRON HCL 4 MG/2ML IJ SOLN
INTRAMUSCULAR | Status: AC
  Filled 2017-04-19: qty 2

## 2017-04-19 MED ORDER — ONDANSETRON HCL 4 MG/2ML IJ SOLN: 4.0000 mg | Freq: Once | INTRAMUSCULAR | Status: DC | PRN

## 2017-04-19 MED ORDER — ACETAMINOPHEN 500 MG PO TABS
1000.0000 mg | ORAL_TABLET | ORAL | Status: AC
  Administered 2017-04-19: 1000 mg via ORAL

## 2017-04-19 MED ORDER — GABAPENTIN 300 MG PO CAPS
ORAL_CAPSULE | ORAL | Status: AC
  Administered 2017-04-19: 300 mg via ORAL
  Filled 2017-04-19: qty 1

## 2017-04-19 MED ORDER — PROPOFOL 10 MG/ML IV BOLUS
INTRAVENOUS | Status: DC | PRN
  Administered 2017-04-19: 180 mg via INTRAVENOUS

## 2017-04-19 MED ORDER — PHENYLEPHRINE HCL 10 MG/ML IJ SOLN
INTRAMUSCULAR | Status: AC
  Filled 2017-04-19: qty 1

## 2017-04-19 MED ORDER — FENTANYL CITRATE (PF) 100 MCG/2ML IJ SOLN
INTRAMUSCULAR | Status: AC
  Filled 2017-04-19: qty 2

## 2017-04-19 MED ORDER — CELECOXIB 200 MG PO CAPS
400.0000 mg | ORAL_CAPSULE | ORAL | Status: AC
  Administered 2017-04-19: 400 mg via ORAL

## 2017-04-19 MED ORDER — BUPIVACAINE-EPINEPHRINE (PF) 0.5% -1:200000 IJ SOLN
INTRAMUSCULAR | Status: DC | PRN
  Administered 2017-04-19: 30 mL via PERINEURAL

## 2017-04-19 MED ORDER — GLYCOPYRROLATE 0.2 MG/ML IJ SOLN
INTRAMUSCULAR | Status: AC
  Filled 2017-04-19: qty 1

## 2017-04-19 MED ORDER — DEXAMETHASONE SODIUM PHOSPHATE 10 MG/ML IJ SOLN
INTRAMUSCULAR | Status: AC
  Filled 2017-04-19: qty 1

## 2017-04-19 MED ORDER — CELECOXIB 200 MG PO CAPS
ORAL_CAPSULE | ORAL | Status: AC
  Administered 2017-04-19: 400 mg via ORAL
  Filled 2017-04-19: qty 2

## 2017-04-19 MED ORDER — ACETAMINOPHEN 500 MG PO TABS
ORAL_TABLET | ORAL | Status: AC
  Administered 2017-04-19: 1000 mg via ORAL
  Filled 2017-04-19: qty 2

## 2017-04-19 MED ORDER — LIDOCAINE HCL (PF) 2 % IJ SOLN
INTRAMUSCULAR | Status: AC
Start: 2017-04-19 — End: ?
  Filled 2017-04-19: qty 2

## 2017-04-19 MED ORDER — LIDOCAINE HCL (CARDIAC) 20 MG/ML IV SOLN
INTRAVENOUS | Status: DC | PRN
  Administered 2017-04-19: 80 mg via INTRAVENOUS

## 2017-04-19 SURGICAL SUPPLY — 46 items
BLADE SURG 15 STRL SS SAFETY (BLADE) ×3 IMPLANT
BRA SURGICAL LRG (MISCELLANEOUS) IMPLANT
BRA SURGICAL XLRG (MISCELLANEOUS) ×3 IMPLANT
BULB RESERV EVAC DRAIN JP 100C (MISCELLANEOUS) IMPLANT
CANISTER SUCT 1200ML W/VALVE (MISCELLANEOUS) ×3 IMPLANT
CHLORAPREP W/TINT 26ML (MISCELLANEOUS) ×3 IMPLANT
CLOSURE WOUND 1/2 X4 (GAUZE/BANDAGES/DRESSINGS) ×1
CNTNR SPEC 2.5X3XGRAD LEK (MISCELLANEOUS)
CONT SPEC 4OZ STER OR WHT (MISCELLANEOUS)
CONTAINER SPEC 2.5X3XGRAD LEK (MISCELLANEOUS) IMPLANT
COVER PROBE FLX POLY STRL (MISCELLANEOUS) ×3 IMPLANT
DRAIN CHANNEL JP 15F RND 16 (MISCELLANEOUS) IMPLANT
DRAPE LAPAROTOMY TRNSV 106X77 (MISCELLANEOUS) ×3 IMPLANT
DRSG TELFA 3X8 NADH (GAUZE/BANDAGES/DRESSINGS) ×3 IMPLANT
ELECT CAUTERY BLADE TIP 2.5 (TIP) ×3
ELECT REM PT RETURN 9FT ADLT (ELECTROSURGICAL) ×3
ELECTRODE CAUTERY BLDE TIP 2.5 (TIP) ×1 IMPLANT
ELECTRODE REM PT RTRN 9FT ADLT (ELECTROSURGICAL) ×1 IMPLANT
GAUZE FLUFF 18X24 1PLY STRL (GAUZE/BANDAGES/DRESSINGS) ×3 IMPLANT
GLOVE BIO SURGEON STRL SZ7.5 (GLOVE) ×3 IMPLANT
GLOVE INDICATOR 8.0 STRL GRN (GLOVE) ×3 IMPLANT
GOWN STRL REUS W/ TWL LRG LVL3 (GOWN DISPOSABLE) ×2 IMPLANT
GOWN STRL REUS W/TWL LRG LVL3 (GOWN DISPOSABLE) ×4
KIT RM TURNOVER STRD PROC AR (KITS) ×3 IMPLANT
LABEL OR SOLS (LABEL) IMPLANT
MARGIN MAP 10MM (MISCELLANEOUS) ×3 IMPLANT
NDL SAFETY 22GX1.5 (NEEDLE) ×3 IMPLANT
NEEDLE HYPO 25X1 1.5 SAFETY (NEEDLE) ×6 IMPLANT
PACK BASIN MINOR ARMC (MISCELLANEOUS) ×3 IMPLANT
SHEARS FOC LG CVD HARMONIC 17C (MISCELLANEOUS) IMPLANT
SHEARS HARMONIC 9CM CVD (BLADE) IMPLANT
STRIP CLOSURE SKIN 1/2X4 (GAUZE/BANDAGES/DRESSINGS) ×2 IMPLANT
SUT ETHILON 3-0 FS-10 30 BLK (SUTURE) ×3
SUT SILK 2 0 (SUTURE) ×2
SUT SILK 2-0 18XBRD TIE 12 (SUTURE) ×1 IMPLANT
SUT VIC AB 2-0 CT1 27 (SUTURE) ×4
SUT VIC AB 2-0 CT1 TAPERPNT 27 (SUTURE) ×2 IMPLANT
SUT VIC AB 4-0 FS2 27 (SUTURE) ×3 IMPLANT
SUT VICRYL+ 3-0 144IN (SUTURE) ×3 IMPLANT
SUTURE EHLN 3-0 FS-10 30 BLK (SUTURE) ×1 IMPLANT
SWABSTK COMLB BENZOIN TINCTURE (MISCELLANEOUS) ×3 IMPLANT
SYR BULB IRRIG 60ML STRL (SYRINGE) IMPLANT
SYR CONTROL 10ML (SYRINGE) ×3 IMPLANT
SYRINGE 10CC LL (SYRINGE) IMPLANT
TAPE TRANSPORE STRL 2 31045 (GAUZE/BANDAGES/DRESSINGS) IMPLANT
WATER STERILE IRR 1000ML POUR (IV SOLUTION) ×3 IMPLANT

## 2017-04-19 NOTE — Op Note (Signed)
Preoperative diagnosis: High-grade DCIS lower outer quadrant left breast.  Postoperative diagnosis: Same.  Operative procedure: Left breast wide excision with ultrasound guidance.  Operating surgeon: Ollen Bowl, M.D.  Anesthesia: Gen. by LMA, Marcaine 0.5% with 1-200,000 units of epinephrine, 30 mL.  Estimated blood loss: 5 mL.  Clinical note: This 56 year old was identified with microcalcifications on screening mammograms and stereotactic biopsy showed evidence of high-grade DCIS. She is a candidate for wide excision.  Operative note: With the patient under adequate general anesthesia the bed was rotated modestly to the right and the breast was taped towards the right shoulder to provide better exposure of the lower aspect of the outer quadrant just above the level of the inframammary fold. Ultrasound was used to confirm the original biopsy cavity. The area was then infiltrated with Marcaine for postoperative analgesia. A radial incision was made beginning about 6 cm from the nipple and extending just above the inframammary fold. The skin was incised sharply and the remaining dissection completed with electrocautery. Approximately 15 mm below the skin the block of tissue which measured approximately 4 x 5 x 5 cm was excised down toward the inframammary fold. The specimen was orientated and specimen radiograph confirmed the previously placed clip. Pathology review showed fat necrosis extending to within 3 mm of the inferior margin. On review of the post biopsy films and had been no significant residual calcifications and this was felt to be adequate. Hemostasis was achieved with electrocautery. The deep parenchyma was approximated with interrupted 2-0 Vicryl figure-of-eight sutures. The skin was then closed with a running 4-0 Vicryl subcuticular suture. The last 10 mL of local anesthetic was infiltrated into the residual cavity for postoperative analgesia and hemostasis. Benzoin, Steri-Strips,  Telfa and fluff gauze followed by surgical bra were placed.  The patient tolerated the procedure well and was taken to the recovery room in stable condition.

## 2017-04-19 NOTE — Anesthesia Preprocedure Evaluation (Addendum)
Anesthesia Evaluation  Patient identified by MRN, date of birth, ID band Patient awake    Reviewed: Allergy & Precautions, NPO status , Patient's Chart, lab work & pertinent test results, reviewed documented beta blocker date and time   Airway Mallampati: III  TM Distance: >3 FB     Dental  (+) Chipped, Partial Upper   Pulmonary pneumonia, resolved, Current Smoker,           Cardiovascular      Neuro/Psych    GI/Hepatic GERD  Controlled,  Endo/Other    Renal/GU      Musculoskeletal  (+) Arthritis ,   Abdominal   Peds  Hematology  (+) anemia ,   Anesthesia Other Findings Obese.  Reproductive/Obstetrics                            Anesthesia Physical Anesthesia Plan  ASA: III  Anesthesia Plan: General   Post-op Pain Management:    Induction: Intravenous  PONV Risk Score and Plan:   Airway Management Planned: LMA  Additional Equipment:   Intra-op Plan:   Post-operative Plan:   Informed Consent: I have reviewed the patients History and Physical, chart, labs and discussed the procedure including the risks, benefits and alternatives for the proposed anesthesia with the patient or authorized representative who has indicated his/her understanding and acceptance.     Plan Discussed with: CRNA  Anesthesia Plan Comments:         Anesthesia Quick Evaluation

## 2017-04-19 NOTE — Transfer of Care (Signed)
Immediate Anesthesia Transfer of Care Note  Patient: Robin Brooks  Procedure(s) Performed: Procedure(s): BREAST LUMPECTOMY (Left)  Patient Location: PACU  Anesthesia Type:General  Level of Consciousness: sedated  Airway & Oxygen Therapy: Patient Spontanous Breathing and Patient connected to face mask oxygen  Post-op Assessment: Report given to RN and Post -op Vital signs reviewed and stable  Post vital signs: Reviewed and stable  Last Vitals:  Vitals:   04/19/17 0608  BP: (!) 155/75  Pulse: 65  Resp: 16  Temp: 36.8 C  SpO2: 97%    Last Pain:  Vitals:   04/19/17 0608  TempSrc: Oral         Complications: No apparent anesthesia complications

## 2017-04-19 NOTE — H&P (Signed)
No change for clinical history or exam.  For left breast wide excision.

## 2017-04-19 NOTE — Anesthesia Post-op Follow-up Note (Signed)
Anesthesia QCDR form completed.        

## 2017-04-19 NOTE — Progress Notes (Signed)
Dr. Byrnett into see 

## 2017-04-19 NOTE — Anesthesia Procedure Notes (Signed)
Procedure Name: LMA Insertion Date/Time: 04/19/2017 7:31 AM Performed by: Johnna Acosta Pre-anesthesia Checklist: Patient identified, Emergency Drugs available, Suction available, Patient being monitored and Timeout performed Patient Re-evaluated:Patient Re-evaluated prior to induction Oxygen Delivery Method: Circle system utilized Preoxygenation: Pre-oxygenation with 100% oxygen Induction Type: IV induction LMA: LMA inserted LMA Size: 4.0 Tube type: Oral Number of attempts: 1 Placement Confirmation: positive ETCO2 and breath sounds checked- equal and bilateral Tube secured with: Tape Dental Injury: Teeth and Oropharynx as per pre-operative assessment

## 2017-04-19 NOTE — Anesthesia Postprocedure Evaluation (Signed)
Anesthesia Post Note  Patient: DAIJANAE RAFALSKI  Procedure(s) Performed: Procedure(s) (LRB): BREAST LUMPECTOMY (Left)  Patient location during evaluation: PACU Anesthesia Type: General Level of consciousness: awake and alert Pain management: pain level controlled Vital Signs Assessment: post-procedure vital signs reviewed and stable Respiratory status: spontaneous breathing, nonlabored ventilation, respiratory function stable and patient connected to nasal cannula oxygen Cardiovascular status: blood pressure returned to baseline and stable Postop Assessment: no signs of nausea or vomiting Anesthetic complications: no     Last Vitals:  Vitals:   04/19/17 0926 04/19/17 1014  BP: (!) 156/72 (!) 177/85  Pulse: 67 71  Resp: 16   Temp: (!) 36.3 C   SpO2: 97% 95%    Last Pain:  Vitals:   04/19/17 1014  TempSrc:   PainSc: 0-No pain                 Akiya Morr S

## 2017-04-20 ENCOUNTER — Telehealth: Payer: Self-pay | Admitting: General Surgery

## 2017-04-20 NOTE — Progress Notes (Signed)
  Oncology Nurse Navigator Documentation  Navigator Location: [facility] (04/20/17 1400)   )Navigator Encounter Type: Telephone (04/20/17 1400) Telephone: Lahoma Crocker Call;Appt Confirmation/Clarification (04/20/17 1400)     Surgery Date: 04/19/17 (04/20/17 1400)             Patient Visit Type:  (post-op) (04/20/17 1400)                              Time Spent with Patient: 30 (04/20/17 1400)   Patient doing well post-op.  Confirmed post-op visit with Dr. Bary Castilla on 04/26/17.

## 2017-04-20 NOTE — Telephone Encounter (Signed)
The patient was notified that the pathology results showed no upstaging. Margins clear with a minimum of 2 mm.  Receptor status pending.  She reports no need for narcotic analgesics postop day 1. Anxious to get her bra off.  Follow-up August 27 as planned.

## 2017-04-22 LAB — SURGICAL PATHOLOGY

## 2017-04-26 ENCOUNTER — Inpatient Hospital Stay: Payer: Self-pay

## 2017-04-26 ENCOUNTER — Encounter: Payer: Self-pay | Admitting: General Surgery

## 2017-04-26 ENCOUNTER — Ambulatory Visit (INDEPENDENT_AMBULATORY_CARE_PROVIDER_SITE_OTHER): Payer: Medicaid Other | Admitting: General Surgery

## 2017-04-26 VITALS — BP 138/82 | HR 82 | Resp 12 | Ht 64.0 in | Wt 244.0 lb

## 2017-04-26 DIAGNOSIS — D0512 Intraductal carcinoma in situ of left breast: Secondary | ICD-10-CM

## 2017-04-26 NOTE — Patient Instructions (Addendum)
Patient to see Dr.Chrystal.

## 2017-04-26 NOTE — Progress Notes (Signed)
Patient ID: Robin Brooks, female   DOB: [birth year], 56 y.o.   MRN: [id number]  Chief Complaint  Patient presents with  . Routine Post Op    left lumpectomy    HPI BAY Robin Brooks is a 56 y.o. female here today for her post op left lumpectomy done on 04/19/2017. Patient states she is doing well. Husband, Jenny Reichmann (Will) is present.  HPI  Past Medical History:  Diagnosis Date  . Anemia   . Arthritis   . Colitis    gastric  . 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 GERD (gastroesophageal reflux disease)   . Heart murmur    ASYMPTOMATIC  . History of methicillin resistant staphylococcus aureus (MRSA) 2013  . IBS (irritable bowel syndrome)   . Pneumonia 2003   H/O    Past Surgical History:  Procedure Laterality Date  . BREAST BIOPSY Left 03/02/2017   left breast stereo calcs  . BREAST CYST EXCISION Left    neg  . BREAST LUMPECTOMY Left 04/19/2017   Wide excision high grade DCIS, ER/PR positive.   Surgeon: Robert Bellow, MD;  Location: [facility] ORS;  Service: General;  Laterality: Left;  . CESAREAN SECTION    . CHOLECYSTECTOMY    . FOOT SURGERY     left  . TUBAL LIGATION  1995    Family History  Problem Relation Age of Onset  . Breast cancer Neg Hx     Social History Social History  Substance Use Topics  . Smoking status: Current Every Day Smoker    Packs/day: 0.25    Years: 35.00    Types: Cigarettes  . Smokeless tobacco: Never Used  . Alcohol use No    No Known Allergies  Current Outpatient Prescriptions  Medication Sig Dispense Refill  . FLUoxetine (PROZAC) 20 MG capsule Take 20 mg by mouth daily before breakfast.    . Genistein (I-COOL FOR MENOPAUSE PO) Take 1 tablet by mouth daily.    . Loperamide HCl (IMODIUM A-D PO) Take 1 tablet by mouth as needed.    . Multiple Vitamin (MULTIVITAMIN WITH MINERALS) TABS tablet Take 1 tablet by mouth daily. Women's 50+    . naproxen sodium (ANAPROX) 220 MG tablet Take 220 mg by  mouth daily.     Marland Kitchen omeprazole (PRILOSEC) 20 MG capsule Take 20 mg by mouth daily before breakfast.     . OVER THE COUNTER MEDICATION Take 1 tablet by mouth daily as needed (allergies). [employer] brand allergy relief medication     No current facility-administered medications for this visit.     Review of Systems Review of Systems  Blood pressure 138/82, pulse 82, resp. rate 12, height 5\' 4"  (1.626 m), weight 244 lb (110.7 kg), last menstrual period 06/20/2015.  Physical Exam Physical Exam  Constitutional: She is oriented to person, place, and time. She appears well-developed and well-nourished.  Cardiovascular: Normal rate, regular rhythm and normal heart sounds.   Pulmonary/Chest: Effort normal and breath sounds normal.    Left breast incision is clean and healing well.   Neurological: She is alert and oriented to person, place, and time.  Skin: Skin is warm and dry.    Data Reviewed 04/19/2017 wide excision: DIAGNOSIS:  A, BREAST, LEFT; WIDE EXCISION:  - DUCTAL CARCINOMA IN SITU.  - SEE CANCER SUMMARY BELOW.  - PRIOR BIOPSY SITE CHANGE WITH CLIP.    DUCTAL CARCINOMA IN SITU OF THE BREAST:  Procedure: Wide excision  Specimen Laterality: Left  Size (Extent) of DCIS: at least 10 mm  Histologic Type: Ductal carcinoma in situ (DCIS)  Nuclear Grade: 3  Necrosis: Present, central (expansive comedo necrosis)  Margins: Negative for DCIS  Distance from closest margins: 2 mm from anterior, inferior, and  medial  Ultrasound examination was completed to evaluate for the possibility of accelerated partial breast radiation. There is a well-defined cavity approximately 2.2 cm below the skin surface measuring 2.0 x 4.9 x 6.0 cm. Adequate spacing for balloon placement.   Assessment    High-grade DCIS.    Plan    Arrangements will made for evaluation by radiation oncology.  Pros and cons of option for whole breast versus partial breast radiation reviewed.  We reviewed  the role of antiestrogen therapy after radiation is completed.    Follow up appointment to be announced. Patient to see Dr. Baruch Gouty .   HPI, Physical Exam, Assessment and Plan have been scribed under the direction and in the presence of Hervey Ard, MD.  Gaspar Cola, CMA  I have completed the exam and reviewed the above documentation for accuracy and completeness.  I agree with the above.  Haematologist has been used and any errors in dictation or transcription are unintentional.  Hervey Ard, M.D., F.A.C.S.   Robert Bellow 04/26/2017, 3:39 PM  Patient has been scheduled for an appointment Dr. Noreene Filbert at the [facility] for 04-29-17 at 9 am. This patient is aware of date, time, and instructions.   Dominga Ferry, CMA

## 2017-04-29 ENCOUNTER — Ambulatory Visit: Payer: Medicaid Other | Admitting: Radiation Oncology

## 2017-05-04 ENCOUNTER — Encounter: Payer: Self-pay | Admitting: Radiation Oncology

## 2017-05-04 ENCOUNTER — Ambulatory Visit
Admission: RE | Admit: 2017-05-04 | Discharge: 2017-05-04 | Disposition: A | Discharge status: Home / Self Care | Payer: Medicaid Other | Source: Ambulatory Visit | Attending: Radiation Oncology | Admitting: Radiation Oncology

## 2017-05-04 VITALS — BP 149/92 | HR 74 | Temp 98.3°F | Resp 20 | Ht 61.81 in | Wt 243.8 lb

## 2017-05-04 DIAGNOSIS — R011 Cardiac murmur, unspecified: Secondary | ICD-10-CM | POA: Diagnosis not present

## 2017-05-04 DIAGNOSIS — Z8614 Personal history of Methicillin resistant Staphylococcus aureus infection: Secondary | ICD-10-CM | POA: Insufficient documentation

## 2017-05-04 DIAGNOSIS — F17210 Nicotine dependence, cigarettes, uncomplicated: Secondary | ICD-10-CM | POA: Insufficient documentation

## 2017-05-04 DIAGNOSIS — D649 Anemia, unspecified: Secondary | ICD-10-CM | POA: Diagnosis not present

## 2017-05-04 DIAGNOSIS — Z79899 Other long term (current) drug therapy: Secondary | ICD-10-CM | POA: Diagnosis not present

## 2017-05-04 DIAGNOSIS — Z170 Estrogen receptor positive status [ER+]: Secondary | ICD-10-CM | POA: Diagnosis not present

## 2017-05-04 DIAGNOSIS — F419 Anxiety disorder, unspecified: Secondary | ICD-10-CM | POA: Insufficient documentation

## 2017-05-04 DIAGNOSIS — D0512 Intraductal carcinoma in situ of left breast: Secondary | ICD-10-CM

## 2017-05-04 DIAGNOSIS — K219 Gastro-esophageal reflux disease without esophagitis: Secondary | ICD-10-CM | POA: Diagnosis not present

## 2017-05-04 DIAGNOSIS — K589 Irritable bowel syndrome without diarrhea: Secondary | ICD-10-CM | POA: Diagnosis not present

## 2017-05-04 DIAGNOSIS — M129 Arthropathy, unspecified: Secondary | ICD-10-CM | POA: Diagnosis not present

## 2017-05-04 DIAGNOSIS — Z510 Encounter for antineoplastic radiation therapy: Secondary | ICD-10-CM | POA: Diagnosis not present

## 2017-05-04 DIAGNOSIS — Z8701 Personal history of pneumonia (recurrent): Secondary | ICD-10-CM | POA: Diagnosis not present

## 2017-05-04 DIAGNOSIS — E669 Obesity, unspecified: Secondary | ICD-10-CM | POA: Diagnosis not present

## 2017-05-04 HISTORY — DX: Anxiety disorder, unspecified: F41.9

## 2017-05-04 HISTORY — DX: Allergy, unspecified, initial encounter: T78.40XA

## 2017-05-04 HISTORY — DX: Malignant neoplasm of unspecified site of unspecified female breast: C50.919

## 2017-05-04 NOTE — Progress Notes (Signed)
Has some tenderness in left breast from incision.

## 2017-05-04 NOTE — Consult Note (Signed)
NEW PATIENT EVALUATION  Name: Robin Brooks  MRN: [id number]  Date:   05/04/2017     DOB: November 19, 1960   This 56 y.o. female patient presents to the clinic for initial evaluation of ductal carcinoma in situ of the left breast status post wide local excision in 56 year old female.  REFERRING PHYSICIAN: Petra Kuba, MD  CHIEF COMPLAINT: No chief complaint on file.   DIAGNOSIS: The encounter diagnosis was Ductal carcinoma in situ (DCIS) of left breast.   PREVIOUS INVESTIGATIONS:  Pathology reports reviewed Mammogram and ultrasound reviewed Clinical notes reviewed  HPI: Patient is a 56 year old female who presented with an abnormal mammogram of her left breast showing a 5 mm group of coarse heterogeneous calcifications suspicious for malignancy. She underwent a ultrasound-guided biopsy which was positive for ductal carcinoma in situ. Tumor was high-grade with comedonecrosis and calcifications. She went on to have a wide local excision showing a nuclear grade 3 ductal carcinoma in situ with lesion measuring 1 cm. Margins were clear at 2 mm. Tumor was strongly ER/PR positive. She has done well postoperatively. She specifically denies breast tenderness cough or bone pain. Patient is morbidly obese. Seroma cavity has been measured and looks adequate for MammoSite balloon placement. She is seen today for radiation oncology opinion.  PLANNED TREATMENT REGIMEN: Accelerated partial breast irradiation of the left breast  PAST MEDICAL HISTORY:  has a past medical history of Allergy; Anemia; Anxiety; Arthritis; Breast cancer ([place]); Colitis; Ductal carcinoma in situ (DCIS) of left breast (03/11/2017); GERD (gastroesophageal reflux disease); Heart murmur; History of methicillin resistant staphylococcus aureus (MRSA) (2013); IBS (irritable bowel syndrome); and Pneumonia (2003).    PAST SURGICAL HISTORY:  Past Surgical History:  Procedure Laterality Date  . BREAST BIOPSY Left 03/02/2017   left  breast stereo calcs  . BREAST CYST EXCISION Left    neg  . BREAST LUMPECTOMY Left 04/19/2017   Wide excision high grade DCIS, ER/PR positive.   Surgeon: Robert Bellow, MD;  Location: [facility] ORS;  Service: General;  Laterality: Left;  . CESAREAN SECTION    . CHOLECYSTECTOMY    . FOOT SURGERY     left  . TUBAL LIGATION  1995    FAMILY HISTORY: family history includes Other in her father.  SOCIAL HISTORY:  reports that she has been smoking Cigarettes.  She has a 8.75 pack-year smoking history. She has never used smokeless tobacco. She reports that she does not drink alcohol or use drugs.  ALLERGIES: Patient has no known allergies.  MEDICATIONS:  Current Outpatient Prescriptions  Medication Sig Dispense Refill  . FLUoxetine (PROZAC) 20 MG capsule Take 20 mg by mouth daily before breakfast.    . Genistein (I-COOL FOR MENOPAUSE PO) Take 1 tablet by mouth daily.    . Loperamide HCl (IMODIUM A-D PO) Take 1 tablet by mouth as needed.    . Multiple Vitamin (MULTIVITAMIN WITH MINERALS) TABS tablet Take 1 tablet by mouth daily. Women's 50+    . omeprazole (PRILOSEC) 20 MG capsule Take 20 mg by mouth daily before breakfast.     . OVER THE COUNTER MEDICATION Take 1 tablet by mouth daily as needed (allergies). [employer] brand allergy relief medication    . naproxen sodium (ANAPROX) 220 MG tablet Take 220 mg by mouth daily.      No current facility-administered medications for this encounter.     ECOG PERFORMANCE STATUS:  0 - Asymptomatic  REVIEW OF SYSTEMS:  Patient denies any weight loss, fatigue, weakness, fever, chills  or night sweats. Patient denies any loss of vision, blurred vision. Patient denies any ringing  of the ears or hearing loss. No irregular heartbeat. Patient denies heart murmur or history of fainting. Patient denies any chest pain or pain radiating to her upper extremities. Patient denies any shortness of breath, difficulty breathing at night, cough or hemoptysis. Patient  denies any swelling in the lower legs. Patient denies any nausea vomiting, vomiting of blood, or coffee ground material in the vomitus. Patient denies any stomach pain. Patient states has had normal bowel movements no significant constipation or diarrhea. Patient denies any dysuria, hematuria or significant nocturia. Patient denies any problems walking, swelling in the joints or loss of balance. Patient denies any skin changes, loss of hair or loss of weight. Patient denies any excessive worrying or anxiety or significant depression. Patient denies any problems with insomnia. Patient denies excessive thirst, polyuria, polydipsia. Patient denies any swollen glands, patient denies easy bruising or easy bleeding. Patient denies any recent infections, allergies or URI. Patient "s visual fields have not changed significantly in recent time.    PHYSICAL EXAM: BP (!) 149/92   Pulse 74   Temp 98.3 F (36.8 C)   Resp 20   Ht 5' 1.81" (1.57 m)   Wt 243 lb 13.3 oz (110.6 kg)   LMP 06/20/2015 (Approximate)   BMI 44.87 kg/m  Morbidly obese female in NAD. She is extremely large pendulous breasts. Left breast in the inferior portion of the left breast is wide local excision scar well-healed. No dominant mass or nodularity is noted in either breast in 2 positions examined. No axillary or supraclavicular adenopathy is appreciated. Well-developed well-nourished patient in NAD. HEENT reveals PERLA, EOMI, discs not visualized.  Oral cavity is clear. No oral mucosal lesions are identified. Neck is clear without evidence of cervical or supraclavicular adenopathy. Lungs are clear to A&P. Cardiac examination is essentially unremarkable with regular rate and rhythm without murmur rub or thrill. Abdomen is benign with no organomegaly or masses noted. Motor sensory and DTR levels are equal and symmetric in the upper and lower extremities. Cranial nerves II through XII are grossly intact. Proprioception is intact. No peripheral  adenopathy or edema is identified. No motor or sensory levels are noted. Crude visual fields are within normal range.  LABORATORY DATA: Pathology reports reviewed    RADIOLOGY RESULTS: Mammograms and ultrasound reviewed   IMPRESSION: Stage 0 (Tis N0 M0) ER/PR positive ductal carcinoma in situ status post wide local excision in 56 year old female  PLAN: At this time despite the patient's young age she would be an extremely hard patient to treat with her large pendulous breasts with external beam treatment. I believe she would be an excellent candidate for accelerated partial breast radiation. Would plan on delivering 3400 cGy in 10 fractions at 340 cGy twice a day using high dose rate remote afterloading through the MammoSite balloon catheter. Risks and benefits of that treatment including possible skin reaction fatigue permanent scarring of the lumpectomy site all were described in detail. We also discussed possible whole breast radiation should the lumen catheter not be able to be placed adequately. Patient also will be candidate for antiestrogen therapy after completion of radiation. We have arrange for MammoSite balloon placement as well as treatment planning in about a week's time.  I would like to take this opportunity to thank you for allowing me to participate in the care of your patient.Armstead Peaks., MD

## 2017-05-06 ENCOUNTER — Telehealth: Payer: Self-pay | Admitting: *Deleted

## 2017-05-06 MED ORDER — CEFADROXIL 500 MG PO CAPS: 500.0000 mg | ORAL_CAPSULE | Freq: Two times a day (BID) | ORAL | 0 refills | Status: DC

## 2017-05-06 NOTE — Telephone Encounter (Signed)
Pt called back, agrees.

## 2017-05-06 NOTE — Telephone Encounter (Signed)
Called and left message for pt to call office regarding schedule. Mammosite schedule to be reviewed with the patient Placement  05-12-17  at [facility]  Scan 05-14-17 Treat 9-17 to [place] will be calling her for more details Aware of ATB and directions reviewed to start when she leaves house to go to [facility]. Aware no showers and to wear her bra while mammosite in place. She will need to call the day before to find out what time to go to Same Day # [phone]

## 2017-05-07 ENCOUNTER — Other Ambulatory Visit: Payer: Self-pay | Admitting: General Surgery

## 2017-05-10 NOTE — Pre-Procedure Instructions (Signed)
PATIENT CONTACTED AND DENIES ANY CHANGES IN MEDICAL HISTORY, ALLERGIES OR MEDICATIONS

## 2017-05-12 ENCOUNTER — Encounter: Payer: Self-pay | Admitting: Certified Registered Nurse Anesthetist

## 2017-05-12 ENCOUNTER — Ambulatory Visit
Admission: RE | Admit: 2017-05-12 | Discharge: 2017-05-12 | Disposition: A | Discharge status: Home / Self Care | Payer: Medicaid Other | Source: Ambulatory Visit | Attending: General Surgery | Admitting: General Surgery

## 2017-05-12 ENCOUNTER — Encounter
Admission: RE | Disposition: A | Discharge status: Home / Self Care | Payer: Self-pay | Source: Ambulatory Visit | Attending: General Surgery

## 2017-05-12 ENCOUNTER — Encounter: Payer: Self-pay | Admitting: *Deleted

## 2017-05-12 DIAGNOSIS — D0512 Intraductal carcinoma in situ of left breast: Secondary | ICD-10-CM | POA: Diagnosis not present

## 2017-05-12 DIAGNOSIS — Z9889 Other specified postprocedural states: Secondary | ICD-10-CM | POA: Insufficient documentation

## 2017-05-12 DIAGNOSIS — Z170 Estrogen receptor positive status [ER+]: Secondary | ICD-10-CM | POA: Insufficient documentation

## 2017-05-12 DIAGNOSIS — Z9851 Tubal ligation status: Secondary | ICD-10-CM | POA: Diagnosis not present

## 2017-05-12 DIAGNOSIS — F17210 Nicotine dependence, cigarettes, uncomplicated: Secondary | ICD-10-CM | POA: Insufficient documentation

## 2017-05-12 DIAGNOSIS — Z9049 Acquired absence of other specified parts of digestive tract: Secondary | ICD-10-CM | POA: Diagnosis not present

## 2017-05-12 DIAGNOSIS — Z79899 Other long term (current) drug therapy: Secondary | ICD-10-CM | POA: Insufficient documentation

## 2017-05-12 DIAGNOSIS — K219 Gastro-esophageal reflux disease without esophagitis: Secondary | ICD-10-CM | POA: Diagnosis not present

## 2017-05-12 DIAGNOSIS — Z8614 Personal history of Methicillin resistant Staphylococcus aureus infection: Secondary | ICD-10-CM | POA: Insufficient documentation

## 2017-05-12 DIAGNOSIS — K589 Irritable bowel syndrome without diarrhea: Secondary | ICD-10-CM | POA: Diagnosis not present

## 2017-05-12 DIAGNOSIS — Z791 Long term (current) use of non-steroidal anti-inflammatories (NSAID): Secondary | ICD-10-CM | POA: Diagnosis not present

## 2017-05-12 DIAGNOSIS — C50912 Malignant neoplasm of unspecified site of left female breast: Secondary | ICD-10-CM | POA: Diagnosis present

## 2017-05-12 HISTORY — PX: BREAST MAMMOSITE: SHX5264

## 2017-05-12 SURGERY — MAMMOSITE BREAST
Anesthesia: LOCAL | Laterality: Left | Wound class: Clean

## 2017-05-12 MED ORDER — SODIUM CHLORIDE 0.9 % IJ SOLN
INTRAMUSCULAR | Status: AC
  Filled 2017-05-12: qty 50

## 2017-05-12 MED ORDER — LIDOCAINE-EPINEPHRINE (PF) 1 %-1:200000 IJ SOLN
INTRAMUSCULAR | Status: AC
  Filled 2017-05-12: qty 30

## 2017-05-12 MED ORDER — SODIUM CHLORIDE 0.9 % IJ SOLN
INTRAMUSCULAR | Status: AC
  Filled 2017-05-12: qty 100

## 2017-05-12 MED ORDER — LIDOCAINE-EPINEPHRINE (PF) 1 %-1:200000 IJ SOLN
INTRAMUSCULAR | Status: DC | PRN
  Administered 2017-05-12: 15 mL

## 2017-05-12 MED ORDER — BACITRACIN ZINC 500 UNIT/GM EX OINT
TOPICAL_OINTMENT | CUTANEOUS | Status: AC
  Filled 2017-05-12: qty 28.35

## 2017-05-12 MED ORDER — SODIUM CHLORIDE 0.9 % IJ SOLN
INTRAMUSCULAR | Status: DC | PRN
  Administered 2017-05-12: 70 mL via INTRAVENOUS

## 2017-05-12 MED ORDER — SODIUM CHLORIDE FLUSH 0.9 % IV SOLN
INTRAVENOUS | Status: AC
  Filled 2017-05-12: qty 20

## 2017-05-12 SURGICAL SUPPLY — 37 items
BASIN GRAD PLASTIC 32OZ STRL (MISCELLANEOUS) IMPLANT
CANISTER SUCT 1200ML W/VALVE (MISCELLANEOUS) IMPLANT
CHLORAPREP W/TINT 26ML (MISCELLANEOUS) ×2 IMPLANT
CNTNR SPEC 2.5X3XGRAD LEK (MISCELLANEOUS) ×2
CONT SPEC 4OZ STER OR WHT (MISCELLANEOUS) ×2
CONTAINER SPEC 2.5X3XGRAD LEK (MISCELLANEOUS) ×2 IMPLANT
COVER PROBE FLX POLY STRL (MISCELLANEOUS) ×2 IMPLANT
DEVICE CAVITY EVALUATION 9031 (MISCELLANEOUS) ×2 IMPLANT
DEVICE DUBIN SPECIMEN MAMMOGRA (MISCELLANEOUS) IMPLANT
DRAPE LAPAROTOMY 100X77 ABD (DRAPES) IMPLANT
DRSG TELFA 4X3 1S NADH ST (GAUZE/BANDAGES/DRESSINGS) IMPLANT
ELECT REM PT RETURN 9FT ADLT (ELECTROSURGICAL) ×2
ELECTRODE REM PT RTRN 9FT ADLT (ELECTROSURGICAL) ×1 IMPLANT
GAUZE SPONGE 4X4 12PLY STRL (GAUZE/BANDAGES/DRESSINGS) ×2 IMPLANT
GLOVE BIO SURGEON STRL SZ7.5 (GLOVE) ×2 IMPLANT
GLOVE INDICATOR 8.0 STRL GRN (GLOVE) ×2 IMPLANT
GOWN STRL REUS W/ TWL LRG LVL3 (GOWN DISPOSABLE) ×2 IMPLANT
GOWN STRL REUS W/TWL LRG LVL3 (GOWN DISPOSABLE) ×2
KIT RM TURNOVER STRD PROC AR (KITS) ×2 IMPLANT
LABEL OR SOLS (LABEL) ×2 IMPLANT
MARGIN MAP 10MM (MISCELLANEOUS) IMPLANT
NDL SAFETY 22GX1.5 (NEEDLE) ×2 IMPLANT
NEEDLE HYPO 25GX1X1/2 BEV (NEEDLE) ×2 IMPLANT
NEEDLE HYPO 25X1 1.5 SAFETY (NEEDLE) ×2 IMPLANT
NS IRRIG 500ML POUR BTL (IV SOLUTION) ×2 IMPLANT
PACK BASIN MINOR ARMC (MISCELLANEOUS) ×2 IMPLANT
SHEARS HARMONIC 9CM CVD (BLADE) IMPLANT
STRIP CLOSURE SKIN 1/2X4 (GAUZE/BANDAGES/DRESSINGS) IMPLANT
SUT ETHILON 3-0 FS-10 30 BLK (SUTURE) ×2
SUT VIC AB 2-0 CT1 27 (SUTURE) ×1
SUT VIC AB 2-0 CT1 TAPERPNT 27 (SUTURE) ×1 IMPLANT
SUT VIC AB 4-0 FS2 27 (SUTURE) ×2 IMPLANT
SUTURE EHLN 3-0 FS-10 30 BLK (SUTURE) ×1 IMPLANT
SWABSTK COMLB BENZOIN TINCTURE (MISCELLANEOUS) ×2 IMPLANT
SYR CONTROL 10ML (SYRINGE) ×2 IMPLANT
TOWEL OR 17X26 4PK STRL BLUE (TOWEL DISPOSABLE) ×2 IMPLANT
TRAY MAMMOSITE APPLI 4 5 CM (KITS) ×2 IMPLANT

## 2017-05-12 NOTE — H&P (Signed)
No change in clinical history or exam. For planned mammosite placement.

## 2017-05-12 NOTE — Op Note (Signed)
Preoperative diagnosis: Left breast cancer candidate for accelerated partial breast radiation.  Postoperative diagnosis: Same.  Operative procedure: Placement MammoSite balloon.  Operating surgeon: Ollen Bowl, M.D.  Anesthesia: 15 mL 1% Xylocaine with 1 100,000 epinephrine.  Estimated blood loss: None.  Clinical note: This 56 year old woman was recently diagnosed with breast cancer and  is a candidate for accelerated partial breast radiation. The MammoSite balloon process had been reviewed with her previously. She took [place] at home prior to presentation as requested.  Operative note: With the patient currently supine the operating table and a small bolster behind the left shoulder ultrasound was used to confirm the seroma cavity noted in the office. The breast chest neck so it was cleansed with ChloraPrep and draped. The above-mentioned local anesthetic was infiltrated. An 8 mm trocar was placed and 80 mL of old seroma was drained without incident. The cavity evaluation device was advanced and inflated with 60 mL of saline after confirming a spherical balloon. This showed complete filling of the previous cavity. The cavity evaluation device was removed and the treatment balloon inflated to 60 mL of fluid. 50 mL saline, 10 mL Conray 60. This showed good spherical expansion. Minimal distance between the skin and the balloon was 1.63 cm. Bacitracin was applied to the exit site. Sterile dressing applied. Patient bra placed.  The procedure was well tolerated.  The patient will present tomorrow for preprocedure CT scan at the cancer center.

## 2017-05-12 NOTE — Discharge Instructions (Signed)
AMBULATORY SURGERY  DISCHARGE INSTRUCTIONS   1) The drugs that you were given will stay in your system until tomorrow so for the next 24 hours you should not:  A) Drive an automobile B) Make any legal decisions C) Drink any alcoholic beverage   2) You may resume regular meals tomorrow.  Today it is better to start with liquids and gradually work up to solid foods.  You may eat anything you prefer, but it is better to start with liquids, then soup and crackers, and gradually work up to solid foods.   3) Please notify your doctor immediately if you have any unusual bleeding, trouble breathing, redness and pain at the surgery site, drainage, fever, or pain not relieved by medication.    4) Additional Instructions:        Please contact your physician with any problems or Same Day Surgery at [phone], Monday through Friday 6 am to 4 pm, or [place] at [facility] number at [phone].    Cancer center tomorrow, for a scan

## 2017-05-13 ENCOUNTER — Ambulatory Visit
Admission: RE | Admit: 2017-05-13 | Discharge: 2017-05-13 | Disposition: A | Discharge status: Home / Self Care | Payer: Medicaid Other | Source: Ambulatory Visit | Attending: Radiation Oncology | Admitting: Radiation Oncology

## 2017-05-13 ENCOUNTER — Ambulatory Visit (INDEPENDENT_AMBULATORY_CARE_PROVIDER_SITE_OTHER): Payer: Medicaid Other | Admitting: General Surgery

## 2017-05-13 ENCOUNTER — Encounter: Payer: Self-pay | Admitting: General Surgery

## 2017-05-13 VITALS — BP 138/74 | HR 70 | Resp 12 | Ht 64.0 in | Wt 261.0 lb

## 2017-05-13 DIAGNOSIS — D0512 Intraductal carcinoma in situ of left breast: Secondary | ICD-10-CM

## 2017-05-13 DIAGNOSIS — Z510 Encounter for antineoplastic radiation therapy: Secondary | ICD-10-CM | POA: Diagnosis not present

## 2017-05-13 NOTE — Progress Notes (Signed)
Patient ID: Robin Brooks, female   DOB: [birth year], 56 y.o.   MRN: [id number]  Chief Complaint  Patient presents with  . Follow-up    HPI Robin Brooks is a 56 y.o. female here today for left breast mammosite pain. She had her mammosite placed on 05/12/2017.The pain is constant.                                                             Marland KitchenHPI  Past Medical History:  Diagnosis Date  . Allergy   . Anemia   . Anxiety   . Arthritis   . Breast cancer ([place])   . Colitis    gastric  . 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 GERD (gastroesophageal reflux disease)   . Heart murmur    ASYMPTOMATIC  . History of methicillin resistant staphylococcus aureus (MRSA) 2013  . IBS (irritable bowel syndrome)   . Pneumonia 2003   H/O    Past Surgical History:  Procedure Laterality Date  . BREAST BIOPSY Left 03/02/2017   left breast stereo calcs  . BREAST CYST EXCISION Left    neg  . BREAST LUMPECTOMY Left 04/19/2017   Wide excision high grade DCIS, ER/PR positive.   Surgeon: Robert Bellow, MD;  Location: [facility] ORS;  Service: General;  Laterality: Left;  . BREAST MAMMOSITE Left 05/12/2017   Procedure: MAMMOSITE BREAST;  Surgeon: Robert Bellow, MD;  Location: [facility] ORS;  Service: General;  Laterality: Left;  . CESAREAN SECTION    . CHOLECYSTECTOMY    . FOOT SURGERY     left  . TUBAL LIGATION  1995    Family History  Problem Relation Age of Onset  . Other Father   . Breast cancer Neg Hx     Social History Social History  Substance Use Topics  . Smoking status: Current Every Day Smoker    Packs/day: 0.25    Years: 35.00    Types: Cigarettes  . Smokeless tobacco: Never Used  . Alcohol use No    No Known Allergies  Current Outpatient Prescriptions  Medication Sig Dispense Refill  . cefadroxil (DURICEF) 500 MG capsule Take 1 capsule (500 mg total) by mouth 2 (two) times daily. Start when she leave to go to [facility] for procedure on  05-12-17 20 capsule 0  . cetirizine (ZYRTEC) 10 MG tablet Take 10 mg by mouth daily as needed for allergies.    Marland Kitchen FLUoxetine (PROZAC) 20 MG capsule Take 20 mg by mouth daily before breakfast.    . GENISTEIN PO Take 1 tablet by mouth daily. I Cool for Menopause otc supplement    . loperamide (IMODIUM A-D) 2 MG tablet Take 2 mg by mouth as needed for diarrhea or loose stools.    . Melatonin 5 MG TABS Take 5 mg by mouth at bedtime as needed (sleep).    . Multiple Vitamin (MULTIVITAMIN WITH MINERALS) TABS tablet Take 1 tablet by mouth daily. Women's 50+    . naproxen sodium (ANAPROX) 220 MG tablet Take 220 mg by mouth daily.     Marland Kitchen omeprazole (PRILOSEC) 20 MG capsule Take 20 mg by mouth daily before breakfast.      No current facility-administered medications for this visit.  Review of Systems Review of Systems  Constitutional: Negative.   Respiratory: Negative.   Gastrointestinal: Negative.     Blood pressure 138/74, pulse 70, resp. rate 12, height 5\' 4"  (1.626 m), weight 261 lb (118.4 kg), last menstrual period 06/20/2015.  Physical Exam Physical Exam  Pulmonary/Chest:      Data Reviewed Ultrasound examination suggested a small fluid pocket medial and superior to the previously placed MammoSite balloon. There was a suggestion that this may have been placed in the deeper layer of the cavity.  The balloon assembly and the surgical site was cleansed with ChloraPrep and 5 mL of 1% plain Xylocaine infiltrated. At this time, the MammoSite balloon was deflated and removed. Under ultrasound guidance the balloon was readvanced and inflated with 60 mL of a saline/Conray 60 mixture. The balloon was spherical and the previously noted fluid collection no longer evident. The minimal spacing to the balloon was 1.53 cm.  This study was done without charge in no images were stored.   Assessment    Repositioning of MammoSite balloon.    Plan    Patient will present for CT imaging at the  [facility] this afternoon as previously scheduled.     HPI, Physical Exam, Assessment and Plan have been scribed under the direction and in the presence of Hervey Ard, MD.  Gaspar Cola, CMA  I have completed the exam and reviewed the above documentation for accuracy and completeness.  I agree with the above.  Haematologist has been used and any errors in dictation or transcription are unintentional.  Hervey Ard, M.D., F.A.C.S.   Robert Bellow 05/14/2017, 7:22 AM

## 2017-05-14 ENCOUNTER — Ambulatory Visit: Payer: Medicaid Other

## 2017-05-14 DIAGNOSIS — Z510 Encounter for antineoplastic radiation therapy: Secondary | ICD-10-CM | POA: Diagnosis not present

## 2017-05-17 ENCOUNTER — Ambulatory Visit
Admission: RE | Admit: 2017-05-17 | Discharge: 2017-05-17 | Disposition: A | Discharge status: Home / Self Care | Payer: Medicaid Other | Source: Ambulatory Visit | Attending: Radiation Oncology | Admitting: Radiation Oncology

## 2017-05-17 DIAGNOSIS — Z510 Encounter for antineoplastic radiation therapy: Secondary | ICD-10-CM | POA: Diagnosis not present

## 2017-05-18 ENCOUNTER — Ambulatory Visit
Admission: RE | Admit: 2017-05-18 | Discharge: 2017-05-18 | Disposition: A | Discharge status: Home / Self Care | Payer: Medicaid Other | Source: Ambulatory Visit | Attending: Radiation Oncology | Admitting: Radiation Oncology

## 2017-05-18 DIAGNOSIS — Z510 Encounter for antineoplastic radiation therapy: Secondary | ICD-10-CM | POA: Diagnosis not present

## 2017-05-18 NOTE — Progress Notes (Signed)
  Oncology Nurse Navigator Documentation  Navigator Location: [facility] (05/18/17 1500) Referral date to RadOnc/MedOnc: 05/04/17 (05/18/17 1500) )Navigator Encounter Type: Lobby (05/18/17 1500)                         Barriers/Navigation Needs: Transportation;Education (05/18/17 1500)                          Time Spent with Patient: 30 (05/18/17 1500)     Oncology Nurse Navigator Documentation  Navigator Location: [facility] (05/18/17 1500) Referral date to RadOnc/MedOnc: 05/04/17 (05/18/17 1500) )Navigator Encounter Type: Lobby (05/18/17 1500)                         Barriers/Navigation Needs: Transportation;Education (05/18/17 1500)                          Time Spent with Patient: 30 (05/18/17 1500)   Patient is receiving Mammosite treatments.  Doing well with treatment. Completes afternoon of  05/21/17 .  States Dr. Bary Castilla has discussed use of antihormonal treatment after radiation.  Introduced her to [employer] offered to patients. Assisted with gas voucher.

## 2017-05-19 ENCOUNTER — Ambulatory Visit
Admission: RE | Admit: 2017-05-19 | Discharge: 2017-05-19 | Disposition: A | Discharge status: Home / Self Care | Payer: Medicaid Other | Source: Ambulatory Visit | Attending: Radiation Oncology | Admitting: Radiation Oncology

## 2017-05-19 DIAGNOSIS — Z510 Encounter for antineoplastic radiation therapy: Secondary | ICD-10-CM | POA: Diagnosis not present

## 2017-05-20 ENCOUNTER — Ambulatory Visit
Admission: RE | Admit: 2017-05-20 | Discharge: 2017-05-20 | Disposition: A | Discharge status: Home / Self Care | Payer: Medicaid Other | Source: Ambulatory Visit | Attending: Radiation Oncology | Admitting: Radiation Oncology

## 2017-05-20 ENCOUNTER — Other Ambulatory Visit: Payer: Self-pay | Admitting: *Deleted

## 2017-05-20 DIAGNOSIS — Z510 Encounter for antineoplastic radiation therapy: Secondary | ICD-10-CM | POA: Diagnosis not present

## 2017-05-20 MED ORDER — FLUCONAZOLE 100 MG PO TABS: 100.0000 mg | ORAL_TABLET | Freq: Every day | ORAL | 0 refills | Status: DC

## 2017-05-21 ENCOUNTER — Ambulatory Visit
Admission: RE | Admit: 2017-05-21 | Discharge: 2017-05-21 | Disposition: A | Discharge status: Home / Self Care | Payer: Medicaid Other | Source: Ambulatory Visit | Attending: Radiation Oncology | Admitting: Radiation Oncology

## 2017-05-21 DIAGNOSIS — Z510 Encounter for antineoplastic radiation therapy: Secondary | ICD-10-CM | POA: Diagnosis not present

## 2017-05-24 ENCOUNTER — Encounter: Payer: Self-pay | Admitting: General Surgery

## 2017-05-24 ENCOUNTER — Other Ambulatory Visit: Payer: Self-pay

## 2017-05-24 ENCOUNTER — Other Ambulatory Visit: Payer: Self-pay | Admitting: *Deleted

## 2017-05-24 ENCOUNTER — Telehealth: Payer: Self-pay

## 2017-05-24 MED ORDER — FLUCONAZOLE 150 MG PO TABS: 150.0000 mg | ORAL_TABLET | Freq: Every day | ORAL | 0 refills | Status: DC

## 2017-05-24 NOTE — Telephone Encounter (Signed)
Patient called and said that she finished her ATB therapy for Mammosite placement on Saturday (05/22/17). She reports symptoms of a yeast infection that started on Friday evening with itching around the vaginal area and her backside. She would like something called in for this today if possible. She has been eating yogurt and it has not helped.

## 2017-05-24 NOTE — Telephone Encounter (Signed)
Per Dr Bary Castilla patient to start Monistat cream use twice daily for symptom relief. Also called in [place] 150 mg tab #2. To take one tab today and then repeat in 3 days. Patient is aware of directions.

## 2017-05-27 ENCOUNTER — Ambulatory Visit (INDEPENDENT_AMBULATORY_CARE_PROVIDER_SITE_OTHER): Payer: Medicaid Other | Admitting: General Surgery

## 2017-05-27 ENCOUNTER — Encounter: Payer: Self-pay | Admitting: General Surgery

## 2017-05-27 ENCOUNTER — Telehealth: Payer: Self-pay | Admitting: *Deleted

## 2017-05-27 VITALS — BP 164/88 | HR 66 | Resp 18 | Ht 62.0 in | Wt 245.0 lb

## 2017-05-27 DIAGNOSIS — D0512 Intraductal carcinoma in situ of left breast: Secondary | ICD-10-CM

## 2017-05-27 MED ORDER — RALOXIFENE HCL 60 MG PO TABS: 60.0000 mg | ORAL_TABLET | Freq: Every day | ORAL | 0 refills | Status: DC

## 2017-05-27 MED ORDER — RALOXIFENE HCL 60 MG PO TABS
60.0000 mg | ORAL_TABLET | Freq: Every day | ORAL | 0 refills | Status: DC
Start: 2017-05-27 — End: 2017-05-27

## 2017-05-27 NOTE — Addendum Note (Signed)
Addended by: Carson Myrtle on: 05/27/2017 11:33 AM   Modules accepted: Orders

## 2017-05-27 NOTE — Telephone Encounter (Signed)
Received note from [facility] that generic Evista not approved by insurance. I called insurance company and they informed me that Brand name RX was ok, sent and no authorization was necessary. [facility] to order medication and let the patient know when available at pharmacy.

## 2017-05-27 NOTE — Progress Notes (Signed)
Patient ID: Robin Brooks, female   DOB: September 20, 1960, 56 y.o.   MRN: [id number]  Chief Complaint  Patient presents with  . Follow-up    HPI Robin Brooks is a 56 y.o. female.  Here for follow up left breast mammosite radiation treatment left breast. She states she is itching to the area but no pain. She did quit smoking last week and feels she is doing good.  HPI  Past Medical History:  Diagnosis Date  . Allergy   . Anemia   . Anxiety   . Arthritis   . Breast cancer ([place])   . Colitis    gastric  . 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 GERD (gastroesophageal reflux disease)   . Heart murmur    ASYMPTOMATIC  . History of methicillin resistant staphylococcus aureus (MRSA) 2013  . IBS (irritable bowel syndrome)   . Pneumonia 2003   H/O    Past Surgical History:  Procedure Laterality Date  . BREAST BIOPSY Left 03/02/2017   left breast stereo calcs  . BREAST CYST EXCISION Left    neg  . BREAST LUMPECTOMY Left 04/19/2017   Wide excision high grade DCIS, ER/PR positive.   Surgeon: Robert Bellow, MD;  Location: [facility] ORS;  Service: General;  Laterality: Left;  . BREAST MAMMOSITE Left 05/12/2017   Procedure: MAMMOSITE BREAST;  Surgeon: Robert Bellow, MD;  Location: [facility] ORS;  Service: General;  Laterality: Left;  . CESAREAN SECTION    . CHOLECYSTECTOMY    . FOOT SURGERY     left  . TUBAL LIGATION  1995    Family History  Problem Relation Age of Onset  . Other Father   . Breast cancer Neg Hx     Social History Social History  Substance Use Topics  . Smoking status: Former Smoker    Packs/day: 0.25    Years: 35.00    Types: Cigarettes    Quit date: 05/18/2017  . Smokeless tobacco: Never Used  . Alcohol use No    No Known Allergies  Current Outpatient Prescriptions  Medication Sig Dispense Refill  . cetirizine (ZYRTEC) 10 MG tablet Take 10 mg by mouth daily as needed for allergies.     . fluconazole (DIFLUCAN) 100 MG tablet Take 1 tablet (100 mg total) by mouth daily. 3 tablet 0  . fluconazole (DIFLUCAN) 150 MG tablet Take 1 tablet (150 mg total) by mouth daily. Take one tablet today, repeat in 3 days 2 tablet 0  . FLUoxetine (PROZAC) 20 MG capsule Take 20 mg by mouth daily before breakfast.    . GENISTEIN PO Take 1 tablet by mouth daily. I Cool for Menopause otc supplement    . loperamide (IMODIUM A-D) 2 MG tablet Take 2 mg by mouth as needed for diarrhea or loose stools.    . Melatonin 5 MG TABS Take 5 mg by mouth at bedtime as needed (sleep).    . Multiple Vitamin (MULTIVITAMIN WITH MINERALS) TABS tablet Take 1 tablet by mouth daily. Women's 50+    . naproxen sodium (ANAPROX) 220 MG tablet Take 220 mg by mouth daily.     Marland Kitchen omeprazole (PRILOSEC) 20 MG capsule Take 20 mg by mouth daily before breakfast.     . raloxifene (EVISTA) 60 MG tablet Take 1 tablet (60 mg total) by mouth daily. 30 tablet 0   No current facility-administered medications for this visit.     Review  of Systems Review of Systems  Constitutional: Negative.   Respiratory: Negative.   Cardiovascular: Negative.     Blood pressure (!) 164/88, pulse 66, resp. rate 18, height 5\' 2"  (1.575 m), weight 245 lb (111.1 kg), last menstrual period 06/20/2015.  Physical Exam Physical Exam  Constitutional: She is oriented to person, place, and time. She appears well-developed and well-nourished.  Pulmonary/Chest:    Incisions clean left breast  Neurological: She is alert and oriented to person, place, and time.  Skin: Skin is warm and dry.  Psychiatric: Her behavior is normal.    Data Reviewed ER/PR positive tumor.  Assessment    Doing well status post wide excision, accelerated partial breast radiation for high-grade DCIS of the left breast.    Plan    The patient is a candidate for antiestrogen therapy. Due to her Prozac, we will substitute Evista for tamoxifen.    Discussed Evista use in  detail with one aspirin 81 mg daily. Potential vasomotor symptoms reviewed. Importance of making use the medication for a full one month trial to see how she tolerates the medication was discussed. Follow up in one month  HPI, Physical Exam, Assessment and Plan have been scribed under the direction and in the presence of Robert Bellow, MD. Karie Fetch, RN  I have completed the exam and reviewed the above documentation for accuracy and completeness.  I agree with the above.  Haematologist has been used and any errors in dictation or transcription are unintentional.  Hervey Ard, M.D., F.A.C.S. Robert Bellow 05/27/2017, 11:23 AM

## 2017-05-27 NOTE — Patient Instructions (Addendum)
The patient is aware to call back for any questions or concerns. one aspirin 81 mg daily Evista daily

## 2017-06-23 ENCOUNTER — Ambulatory Visit: Payer: Self-pay | Admitting: Radiation Oncology

## 2017-06-24 ENCOUNTER — Ambulatory Visit
Admission: RE | Admit: 2017-06-24 | Discharge: 2017-06-24 | Disposition: A | Discharge status: Home / Self Care | Payer: Medicaid Other | Source: Ambulatory Visit | Attending: Radiation Oncology | Admitting: Radiation Oncology

## 2017-06-24 ENCOUNTER — Ambulatory Visit (INDEPENDENT_AMBULATORY_CARE_PROVIDER_SITE_OTHER): Payer: Medicaid Other | Admitting: General Surgery

## 2017-06-24 VITALS — BP 130/70 | Resp 12 | Ht 62.0 in | Wt 244.0 lb

## 2017-06-24 VITALS — BP 138/95 | HR 71 | Temp 97.6°F | Resp 12 | Wt 221.6 lb

## 2017-06-24 DIAGNOSIS — R232 Flushing: Secondary | ICD-10-CM | POA: Insufficient documentation

## 2017-06-24 DIAGNOSIS — Z79811 Long term (current) use of aromatase inhibitors: Secondary | ICD-10-CM | POA: Insufficient documentation

## 2017-06-24 DIAGNOSIS — Z170 Estrogen receptor positive status [ER+]: Secondary | ICD-10-CM | POA: Insufficient documentation

## 2017-06-24 DIAGNOSIS — D0512 Intraductal carcinoma in situ of left breast: Secondary | ICD-10-CM

## 2017-06-24 NOTE — Patient Instructions (Signed)
he patient has been asked to return to the office in eight months with a bilateral diagnostic mammogram. The patient is aware to call back for any questions or concerns.

## 2017-06-24 NOTE — Progress Notes (Signed)
Patient ID: Robin Brooks, female   DOB: [birth year], 56 y.o.   MRN: [id number]  Chief Complaint  Patient presents with  . Follow-up    HPI Robin Brooks is a 56 y.o. female here today for her follow up breast cancer. Patient states she is doing okay. Patient states she is having trouble sleeping in the last month.  Tolerating antiestrogen therapy well. No significant hot flashes or mood swings. Occasional night sweats.  Uses melatonin at night for establishing sleep, but frequently awakens to the night and has difficulty falling asleep again.  HPI  Past Medical History:  Diagnosis Date  . Allergy   . Anemia   . Anxiety   . Arthritis   . Breast cancer ([place])   . Colitis    gastric  . 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 GERD (gastroesophageal reflux disease)   . Heart murmur    ASYMPTOMATIC  . History of methicillin resistant staphylococcus aureus (MRSA) 2013  . IBS (irritable bowel syndrome)   . Pneumonia 2003   H/O    Past Surgical History:  Procedure Laterality Date  . BREAST BIOPSY Left 03/02/2017   left breast stereo calcs  . BREAST CYST EXCISION Left    neg  . BREAST LUMPECTOMY Left 04/19/2017   Wide excision high grade DCIS, ER/PR positive.   Surgeon: Robert Bellow, MD;  Location: [facility] ORS;  Service: General;  Laterality: Left;  . BREAST MAMMOSITE Left 05/12/2017   Procedure: MAMMOSITE BREAST;  Surgeon: Robert Bellow, MD;  Location: [facility] ORS;  Service: General;  Laterality: Left;  . CESAREAN SECTION    . CHOLECYSTECTOMY    . FOOT SURGERY     left  . TUBAL LIGATION  1995    Family History  Problem Relation Age of Onset  . Other Father   . Breast cancer Neg Hx     Social History Social History  Substance Use Topics  . Smoking status: Former Smoker    Packs/day: 0.25    Years: 35.00    Types: Cigarettes    Quit date: 05/18/2017  . Smokeless tobacco: Never Used  . Alcohol  use No    No Known Allergies  Current Outpatient Prescriptions  Medication Sig Dispense Refill  . cetirizine (ZYRTEC) 10 MG tablet Take 10 mg by mouth daily as needed for allergies.    . fluconazole (DIFLUCAN) 100 MG tablet Take 1 tablet (100 mg total) by mouth daily. 3 tablet 0  . fluconazole (DIFLUCAN) 150 MG tablet Take 1 tablet (150 mg total) by mouth daily. Take one tablet today, repeat in 3 days 2 tablet 0  . FLUoxetine (PROZAC) 20 MG capsule Take 20 mg by mouth daily before breakfast.    . GENISTEIN PO Take 1 tablet by mouth daily. I Cool for Menopause otc supplement    . loperamide (IMODIUM A-D) 2 MG tablet Take 2 mg by mouth as needed for diarrhea or loose stools.    . Melatonin 5 MG TABS Take 5 mg by mouth at bedtime as needed (sleep).    . Multiple Vitamin (MULTIVITAMIN WITH MINERALS) TABS tablet Take 1 tablet by mouth daily. Women's 50+    . naproxen sodium (ANAPROX) 220 MG tablet Take 220 mg by mouth daily.     Marland Kitchen omeprazole (PRILOSEC) 20 MG capsule Take 20 mg by mouth daily before breakfast.     . raloxifene (EVISTA) 60 MG tablet Take  1 tablet (60 mg total) by mouth daily. 30 tablet 0  . raloxifene (EVISTA) 60 MG tablet Take 1 tablet (60 mg total) by mouth daily. 30 tablet 0   No current facility-administered medications for this visit.     Review of Systems Review of Systems  Constitutional: Negative.   Respiratory: Negative.   Cardiovascular: Negative.     Blood pressure 130/70, resp. rate 12, height 5\' 2"  (1.575 m), weight 244 lb (110.7 kg), last menstrual period 06/20/2015.  Physical Exam Physical Exam  Constitutional: She is oriented to person, place, and time. She appears well-developed and well-nourished.  Eyes: Conjunctivae are normal. No scleral icterus.  Pulmonary/Chest: Right breast exhibits no inverted nipple, no mass, no nipple discharge, no skin change and no tenderness. Left breast exhibits no inverted nipple, no mass, no nipple discharge, no skin  change and no tenderness.    Lymphadenopathy:    She has no cervical adenopathy.    She has no axillary adenopathy.  Neurological: She is alert and oriented to person, place, and time.  Skin: Skin is warm and dry.    Data Reviewed No new data  Assessment    Doing well status post wide excision, accelerated partial breast radiation for high-grade DCIS of the lower outer quadrant of the left breast.    Plan    Patient may consider making use of a second dose of melatonin if she awakens or a trial of Benadryl to improve her successive returning to sleep.  She'll continue Evista daily.    The patient has been asked to return to the office in eight months with a bilateral diagnostic mammogram. The patient is aware to call back for any questions or concerns.   HPI, Physical Exam, Assessment and Plan have been scribed under the direction and in the presence of Hervey Ard, MD.  Gaspar Cola, CMA  I have completed the exam and reviewed the above documentation for accuracy and completeness.  I agree with the above.  Haematologist has been used and any errors in dictation or transcription are unintentional.  Hervey Ard, M.D., F.A.C.S.   Robert Bellow 06/24/2017, 7:58 PM

## 2017-06-24 NOTE — Progress Notes (Signed)
Patient here for follow up. No changes since last appointment.  

## 2017-06-24 NOTE — Progress Notes (Signed)
Radiation Oncology Follow up Note  Name: Robin Brooks   Date:   06/24/2017 MRN:  [id number] DOB: [birth year]    This 56 y.o. female presents to the clinic today for one-month follow-up spatienttatus post accelerated partial breast irradiation to her left breast for ductal carcinoma in situ ER/PR positive.  REFERRING PROVIDER: Petra Kuba, MD  HPI: Patient is a 56 year old female now out 1 month having completed accelerated partial breast radiation to her left breast for ductal carcinoma in situ ER/PR positive. Seen today in routine follow-up she is doing well. She specifically denies breast tenderness cough or bone pain.. She has quit smoking. She's been put on Evista tolerating that well although having some hot flashes.  COMPLICATIONS OF TREATMENT: none  FOLLOW UP COMPLIANCE: keeps appointments   PHYSICAL EXAM:  BP (!) 138/95 (BP Location: Right Wrist, Patient Position: Sitting, Cuff Size: Small)   Pulse 71   Temp 97.6 F (36.4 C) (Tympanic)   Resp 12   Wt 221 lb 9 oz (100.5 kg)   LMP 06/20/2015 (Approximate)   BMI 40.52 kg/m  Lungs are clear to A&P cardiac examination essentially unremarkable with regular rate and rhythm. No dominant mass or nodularity is noted in either breast in 2 positions examined. Incision is well-healed. No axillary or supraclavicular adenopathy is appreciated. Cosmetic result is excellent. Well-developed well-nourished patient in NAD. HEENT reveals PERLA, EOMI, discs not visualized.  Oral cavity is clear. No oral mucosal lesions are identified. Neck is clear without evidence of cervical or supraclavicular adenopathy. Lungs are clear to A&P. Cardiac examination is essentially unremarkable with regular rate and rhythm without murmur rub or thrill. Abdomen is benign with no organomegaly or masses noted. Motor sensory and DTR levels are equal and symmetric in the upper and lower extremities. Cranial nerves II through XII are grossly intact. Proprioception  is intact. No peripheral adenopathy or edema is identified. No motor or sensory levels are noted. Crude visual fields are within normal range.  RADIOLOGY RESULTS: No current films for review  PLAN: At the present time patient is doing well with no evidence of disease with no significant side effects 1 month out from accelerated partial breast radiation. I'm please were overall progress. I've asked to see her back in 4-5 months for follow-up. Patient knows to call sooner with any concerns.  I would like to take this opportunity to thank you for allowing me to participate in the care of your patient.Armstead Peaks., MD

## 2017-06-28 ENCOUNTER — Other Ambulatory Visit: Payer: Self-pay | Admitting: General Surgery

## 2017-07-29 ENCOUNTER — Telehealth: Payer: Self-pay

## 2017-07-29 ENCOUNTER — Other Ambulatory Visit: Payer: Self-pay

## 2017-07-29 MED ORDER — RALOXIFENE HCL 60 MG PO TABS: 60.0000 mg | ORAL_TABLET | Freq: Every day | ORAL | 3 refills | Status: DC

## 2017-07-29 NOTE — Telephone Encounter (Signed)
Patient called and would like her Evista called in to her pharmacy for 90 day supply instead of 30 day as this would be easier for her. She reports she is doing well on this medication.  Evista sent in for 90 day supply with 3 refills.

## 2017-09-07 ENCOUNTER — Telehealth: Payer: Self-pay | Admitting: *Deleted

## 2017-09-07 NOTE — Telephone Encounter (Signed)
Received form that authorization was needed for Evista. According to [place] [facility] med needs to be run as generic not brand that it changed in December. [facility] notified, they will notify pt.

## 2017-11-25 ENCOUNTER — Other Ambulatory Visit: Payer: Self-pay

## 2017-11-25 ENCOUNTER — Ambulatory Visit
Admission: RE | Admit: 2017-11-25 | Discharge: 2017-11-25 | Disposition: A | Discharge status: Home / Self Care | Payer: Medicaid Other | Source: Ambulatory Visit | Attending: Radiation Oncology | Admitting: Radiation Oncology

## 2017-11-25 VITALS — BP 135/82 | HR 77 | Temp 99.0°F | Resp 16 | Ht 62.0 in | Wt 250.2 lb

## 2017-11-25 DIAGNOSIS — Z923 Personal history of irradiation: Secondary | ICD-10-CM | POA: Insufficient documentation

## 2017-11-25 DIAGNOSIS — D0512 Intraductal carcinoma in situ of left breast: Secondary | ICD-10-CM | POA: Insufficient documentation

## 2017-11-25 DIAGNOSIS — Z79811 Long term (current) use of aromatase inhibitors: Secondary | ICD-10-CM | POA: Diagnosis not present

## 2017-11-25 DIAGNOSIS — Z170 Estrogen receptor positive status [ER+]: Secondary | ICD-10-CM | POA: Diagnosis not present

## 2017-11-25 NOTE — Progress Notes (Signed)
Radiation Oncology Follow up Note  Name: Robin Brooks   Date:   11/25/2017 MRN:  [id number] DOB: 1961/05/05    This 57 y.o. female presents to the clinic today for six-month follow-up status post accelerated partial breast radiation to her left breast for ER/PR positive ductal carcinoma in situ.Marland Kitchen  REFERRING PROVIDER: Petra Kuba, MD  HPI: patient is a 57 year old female now seen out 6 months having completed accelerated partial breast radiation to her left breast for ER/PR positive ductal carcinoma in situ. She is seen today in routine follow-up and is doing well she states she's had the flu over the past several months and multiple URIs believing this is due to radiation which I've explained to her isnot associated.She states she feels some lumps in her left breast which I've assured her is the scarring from her surgery and a Exira partial breast radiation.he is currently on Evista. She's not had a follow-up mammogram although's has follow-up appointments with Dr. Tollie Pizza next week.  COMPLICATIONS OF TREATMENT: none  FOLLOW UP COMPLIANCE: keeps appointments   PHYSICAL EXAM:  BP 135/82 (BP Location: Left Wrist, Patient Position: Sitting, Cuff Size: Small)   Pulse 77   Temp 99 F (37.2 C) (Tympanic)   Resp 16   Ht 5\' 2"  (1.575 m)   Wt 250 lb 3.6 oz (113.5 kg)   LMP 06/20/2015 (Approximate)   BMI 45.77 kg/m  Lungs are clear to A&P cardiac examination essentially unremarkable with regular rate and rhythm. No dominant mass or nodularity is noted in either breast in 2 positions examined. Incision is well-healed. No axillary or supraclavicular adenopathy is appreciated. Cosmetic result is excellent.there is some thickening in the lumpectomy site which we would expect. Well-developed well-nourished patient in NAD. HEENT reveals PERLA, EOMI, discs not visualized.  Oral cavity is clear. No oral mucosal lesions are identified. Neck is clear without evidence of cervical or  supraclavicular adenopathy. Lungs are clear to A&P. Cardiac examination is essentially unremarkable with regular rate and rhythm without murmur rub or thrill. Abdomen is benign with no organomegaly or masses noted. Motor sensory and DTR levels are equal and symmetric in the upper and lower extremities. Cranial nerves II through XII are grossly intact. Proprioception is intact. No peripheral adenopathy or edema is identified. No motor or sensory levels are noted. Crude visual fields are within normal range.  RADIOLOGY RESULTS: no current films for review  PLAN: at the present time patient is doing well with no evidence of disease. She will see Dr. Marlyn Corporal next week and I'm sure will have follow-up mammograms ordered. She continues on Evista without side effect. I've asked to see her back in 6 months for follow-up and will go once your follow-up visits. Patient knows to call with any concerns.  I would like to take this opportunity to thank you for allowing me to participate in the care of your patient.Noreene Filbert, MD

## 2017-12-02 ENCOUNTER — Ambulatory Visit: Payer: Medicaid Other | Admitting: General Surgery

## 2017-12-02 ENCOUNTER — Encounter: Payer: Self-pay | Admitting: General Surgery

## 2017-12-02 VITALS — BP 138/74 | HR 74 | Resp 14 | Ht 62.0 in | Wt 249.0 lb

## 2017-12-02 DIAGNOSIS — D0512 Intraductal carcinoma in situ of left breast: Secondary | ICD-10-CM

## 2017-12-02 NOTE — Patient Instructions (Signed)
Patient to return as scheduled. The patient is aware to call back for any questions or concerns.  

## 2017-12-02 NOTE — Progress Notes (Signed)
Patient ID: Robin Brooks, female   DOB: October 26, 1960, 57 y.o.   MRN: [id number]  Chief Complaint  Patient presents with  . Other    HPI Robin Brooks is a 57 y.o. female here today for a evaluation of a mass in her breast.Patient states she noticed some lumps in her left breast about twomonth ago. No pain . Patient states she is scheduled for a mammogram in June,2019.  Husband, Jenny Reichmann is present at visit.   HPI  Past Medical History:  Diagnosis Date  . Allergy   . Anemia   . Anxiety   . Arthritis   . Breast cancer ([place])   . Colitis    gastric  . 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 GERD (gastroesophageal reflux disease)   . Heart murmur    ASYMPTOMATIC  . History of methicillin resistant staphylococcus aureus (MRSA) 2013  . IBS (irritable bowel syndrome)   . Pneumonia 2003   H/O    Past Surgical History:  Procedure Laterality Date  . BREAST BIOPSY Left 03/02/2017   left breast stereo calcs  . BREAST CYST EXCISION Left    neg  . BREAST LUMPECTOMY Left 04/19/2017   Wide excision high grade DCIS, ER/PR positive.   Surgeon: Robert Bellow, MD;  Location: [facility] ORS;  Service: General;  Laterality: Left;  . BREAST MAMMOSITE Left 05/12/2017   Procedure: MAMMOSITE BREAST;  Surgeon: Robert Bellow, MD;  Location: [facility] ORS;  Service: General;  Laterality: Left;  . CESAREAN SECTION    . CHOLECYSTECTOMY    . FOOT SURGERY     left  . TUBAL LIGATION  1995    Family History  Problem Relation Age of Onset  . Other Father   . Breast cancer Neg Hx     Social History Social History   Tobacco Use  . Smoking status: Former Smoker    Packs/day: 0.25    Years: 35.00    Pack years: 8.75    Types: Cigarettes    Last attempt to quit: 05/18/2017    Years since quitting: 0.5  . Smokeless tobacco: Never Used  Substance Use Topics  . Alcohol use: No  . Drug use: No    No Known Allergies  Current  Outpatient Medications  Medication Sig Dispense Refill  . aspirin 81 MG chewable tablet Chew by mouth daily.    . cetirizine (ZYRTEC) 10 MG tablet Take 10 mg by mouth daily as needed for allergies.    Marland Kitchen FLUoxetine (PROZAC) 20 MG capsule Take 20 mg by mouth daily before breakfast.    . loperamide (IMODIUM A-D) 2 MG tablet Take 2 mg by mouth as needed for diarrhea or loose stools.    . Melatonin 5 MG TABS Take 5 mg by mouth at bedtime as needed (sleep).    . Multiple Vitamin (MULTIVITAMIN WITH MINERALS) TABS tablet Take 1 tablet by mouth daily. Women's 50+    . naproxen sodium (ANAPROX) 220 MG tablet Take 220 mg by mouth daily.     Marland Kitchen omeprazole (PRILOSEC) 20 MG capsule Take 20 mg by mouth daily before breakfast.     . raloxifene (EVISTA) 60 MG tablet Take 1 tablet (60 mg total) by mouth daily. 90 tablet 3   No current facility-administered medications for this visit.     Review of Systems Review of Systems  Constitutional: Negative.   Respiratory: Negative.   Cardiovascular: Negative.  Blood pressure 138/74, pulse 74, resp. rate 14, height 5\' 2"  (1.575 m), weight 249 lb (112.9 kg), last menstrual period 06/20/2015.  Physical Exam Physical Exam  Constitutional: She is oriented to person, place, and time. She appears well-developed and well-nourished.  Pulmonary/Chest: Left breast exhibits no inverted nipple, no mass, no nipple discharge, no skin change and no tenderness.    Neurological: She is alert and oriented to person, place, and time.  Skin: Skin is warm and dry.       Assessment    Post radiation thickening.   Tolerating Evista therapy.     Plan   Patient to return as scheduled. The patient is aware to call back for any questions or concerns.   HPI, Physical Exam, Assessment and Plan have been scribed under the direction and in the presence of Hervey Ard, MD.  Gaspar Cola, CMA  I have completed the exam and reviewed the above documentation for  accuracy and completeness.  I agree with the above.  Haematologist has been used and any errors in dictation or transcription are unintentional.  Hervey Ard, M.D., F.A.C.S.   Forest Gleason Shakyla Nolley 12/02/2017, 8:30 PM

## 2017-12-09 ENCOUNTER — Other Ambulatory Visit: Payer: Self-pay

## 2017-12-09 DIAGNOSIS — D0512 Intraductal carcinoma in situ of left breast: Secondary | ICD-10-CM

## 2018-02-22 ENCOUNTER — Ambulatory Visit
Admission: RE | Admit: 2018-02-22 | Discharge: 2018-02-22 | Disposition: A | Discharge status: Home / Self Care | Payer: Medicaid Other | Source: Ambulatory Visit | Attending: General Surgery | Admitting: General Surgery

## 2018-02-22 DIAGNOSIS — D0512 Intraductal carcinoma in situ of left breast: Secondary | ICD-10-CM | POA: Diagnosis not present

## 2018-02-22 HISTORY — DX: Personal history of irradiation: Z92.3

## 2018-03-01 ENCOUNTER — Encounter: Payer: Self-pay | Admitting: General Surgery

## 2018-03-01 ENCOUNTER — Ambulatory Visit: Payer: Medicaid Other | Admitting: General Surgery

## 2018-03-01 VITALS — BP 132/76 | HR 72 | Resp 13 | Ht 62.0 in | Wt 249.0 lb

## 2018-03-01 DIAGNOSIS — D0512 Intraductal carcinoma in situ of left breast: Secondary | ICD-10-CM

## 2018-03-01 NOTE — Patient Instructions (Addendum)
The patient has been asked to return to the office in one year with a bilateral diagnostic mammogram.The patient is aware to call back for any questions or concerns. 

## 2018-03-01 NOTE — Progress Notes (Signed)
Patient ID: Robin Brooks, female   DOB: 03-30-1961, 57 y.o.   MRN: [id number]  Chief Complaint  Patient presents with  . Follow-up    HPI Robin Brooks is a 57 y.o. female.  who presents for her follow up breast cancer and a breast evaluation. The most recent mammogram was done on 02-22-18.  Patient does perform regular self breast checks and gets regular mammograms done.     HPI  Past Medical History:  Diagnosis Date  . Allergy   . Anemia   . Anxiety   . Arthritis   . Breast cancer ([place]) 2018   DCIS, High-grade, comedo necrosis ER/PR positive  . Colitis    gastric  . GERD (gastroesophageal reflux disease)   . Heart murmur    ASYMPTOMATIC  . History of methicillin resistant staphylococcus aureus (MRSA) 2013  . IBS (irritable bowel syndrome)   . Personal history of radiation therapy 2018   Left breast cancer  . Pneumonia 2003   H/O    Past Surgical History:  Procedure Laterality Date  . BREAST BIOPSY Left 03/02/2017   DCIS  . BREAST CYST EXCISION Left 80s   neg  . BREAST LUMPECTOMY Left 04/19/2017   Wide excision high grade DCIS, ER/PR positive.   Surgeon: Robert Bellow, MD;  Location: [facility] ORS;  Service: General;  Laterality: Left;  . BREAST MAMMOSITE Left 05/12/2017   Procedure: MAMMOSITE BREAST;  Surgeon: Robert Bellow, MD;  Location: [facility] ORS;  Service: General;  Laterality: Left;  . CESAREAN SECTION    . CHOLECYSTECTOMY    . FOOT SURGERY     left  . TUBAL LIGATION  1995    Family History  Problem Relation Age of Onset  . Other Father   . Breast cancer Neg Hx     Social History Social History   Tobacco Use  . Smoking status: Former Smoker    Packs/day: 0.25    Years: 35.00    Pack years: 8.75    Types: Cigarettes    Last attempt to quit: 05/18/2017    Years since quitting: 0.7  . Smokeless tobacco: Never Used  Substance Use Topics  . Alcohol use: No  . Drug use: No    No Known Allergies  Current Outpatient Medications   Medication Sig Dispense Refill  . aspirin 81 MG chewable tablet Chew by mouth daily.    . cetirizine (ZYRTEC) 10 MG tablet Take 10 mg by mouth daily as needed for allergies.    Marland Kitchen FLUoxetine (PROZAC) 20 MG capsule Take 20 mg by mouth daily before breakfast.    . loperamide (IMODIUM A-D) 2 MG tablet Take 2 mg by mouth as needed for diarrhea or loose stools.    . Melatonin 5 MG TABS Take 5 mg by mouth at bedtime as needed (sleep).    . Multiple Vitamin (MULTIVITAMIN WITH MINERALS) TABS tablet Take 1 tablet by mouth daily. Women's 50+    . naproxen sodium (ANAPROX) 220 MG tablet Take 220 mg by mouth daily.     Marland Kitchen omeprazole (PRILOSEC) 20 MG capsule Take 20 mg by mouth daily before breakfast.     . raloxifene (EVISTA) 60 MG tablet Take 1 tablet (60 mg total) by mouth daily. 90 tablet 3   No current facility-administered medications for this visit.     Review of Systems Review of Systems  Constitutional: Negative.   Respiratory: Negative.   Cardiovascular: Negative.     Blood pressure 132/76, pulse 72,  resp. rate 13, height 5\' 2"  (1.575 m), weight 249 lb (112.9 kg), last menstrual period 06/20/2015.  Physical Exam Physical Exam  Constitutional: She is oriented to person, place, and time. She appears well-developed and well-nourished.  Eyes: Conjunctivae are normal. No scleral icterus.  Neck: Neck supple.  Cardiovascular: Normal rate, regular rhythm and normal heart sounds.  Pulmonary/Chest: Effort normal and breath sounds normal. Right breast exhibits no inverted nipple, no mass, no nipple discharge, no skin change and no tenderness. Left breast exhibits no inverted nipple, no mass, no nipple discharge, no skin change and no tenderness. Breasts are asymmetrical.    Lymphadenopathy:    She has no cervical adenopathy.    She has no axillary adenopathy.       Right: No supraclavicular adenopathy present.       Left: No supraclavicular adenopathy present.  Neurological: She is alert and  oriented to person, place, and time.  Skin: Skin is warm and dry.    Data Reviewed Bilateral diagnostic mammograms of February 22, 2018 were reviewed.  BI-RADS-2.  Assessment    Doing well post wide excision DCIS of the left breast.    Good tolerance of Evista.  (Chosen based on ongoing need for Prozac.  Those (  Plan  The patient has been asked to return to the office in one year with a bilateral diagnostic mammogram.The patient is aware to call back for any questions or concerns.   HPI, Physical Exam, Assessment and Plan have been scribed under the direction and in the presence of Hervey Ard, MD.  Gaspar Cola, CMA  I have completed the exam and reviewed the above documentation for accuracy and completeness.  I agree with the above.  Haematologist has been used and any errors in dictation or transcription are unintentional.  Hervey Ard, M.D., F.A.C.S. Robin Brooks Robin Brooks 03/01/2018, 6:43 PM

## 2018-06-09 ENCOUNTER — Ambulatory Visit: Payer: Self-pay | Admitting: Radiation Oncology

## 2018-06-23 ENCOUNTER — Ambulatory Visit: Payer: Self-pay | Admitting: Radiation Oncology

## 2018-06-29 ENCOUNTER — Encounter: Payer: Self-pay | Admitting: Radiation Oncology

## 2018-06-29 ENCOUNTER — Ambulatory Visit
Admission: RE | Admit: 2018-06-29 | Discharge: 2018-06-29 | Disposition: A | Discharge status: Home / Self Care | Payer: Medicaid Other | Source: Ambulatory Visit | Attending: Radiation Oncology | Admitting: Radiation Oncology

## 2018-06-29 ENCOUNTER — Other Ambulatory Visit: Payer: Self-pay

## 2018-06-29 VITALS — BP 141/89 | HR 75 | Temp 97.8°F | Wt 253.2 lb

## 2018-06-29 DIAGNOSIS — D0512 Intraductal carcinoma in situ of left breast: Secondary | ICD-10-CM | POA: Insufficient documentation

## 2018-06-29 DIAGNOSIS — Z170 Estrogen receptor positive status [ER+]: Secondary | ICD-10-CM | POA: Diagnosis not present

## 2018-06-29 DIAGNOSIS — Z923 Personal history of irradiation: Secondary | ICD-10-CM | POA: Diagnosis not present

## 2018-06-29 DIAGNOSIS — Z79811 Long term (current) use of aromatase inhibitors: Secondary | ICD-10-CM | POA: Diagnosis not present

## 2018-06-29 NOTE — Progress Notes (Signed)
Radiation Oncology Follow up Note  Name: Robin Brooks   Date:   06/29/2018 MRN:  [id number] DOB: Dec 03, 1960    This 57 y.o. female presents to the clinic today for 1 year follow-up status post accelerated partial breast radiation to her left breast for ER/PR positive ductal carcinoma in situ.  REFERRING PROVIDER: Petra Kuba, MD  HPI: patient is a 57 year old female now seen out 1 year having completed accelerated partial breast radiation to her left breast for ER/PR positive ductal carcinoma in situ. Seen today in routine follow-up she is doing well she specifically denies breast tenderness cough or bone pain..she had mammograms in June which I have reviewed were BI-RADS 2 benign.she's currently on Evista tolerate that well without side effect.  COMPLICATIONS OF TREATMENT: none  FOLLOW UP COMPLIANCE: keeps appointments   PHYSICAL EXAM:  BP (!) 141/89 (BP Location: Right Arm, Patient Position: Sitting)   Pulse 75   Temp 97.8 F (36.6 C) (Tympanic)   Wt 253 lb 3.2 oz (114.8 kg)   LMP 06/20/2015 (Approximate)   BMI 46.31 kg/m  Lungs are clear to A&P cardiac examination essentially unremarkable with regular rate and rhythm. No dominant mass or nodularity is noted in either breast in 2 positions examined. Incision is well-healed. No axillary or supraclavicular adenopathy is appreciated. Cosmetic result is excellent.Well-developed well-nourished patient in NAD. HEENT reveals PERLA, EOMI, discs not visualized.  Oral cavity is clear. No oral mucosal lesions are identified. Neck is clear without evidence of cervical or supraclavicular adenopathy. Lungs are clear to A&P. Cardiac examination is essentially unremarkable with regular rate and rhythm without murmur rub or thrill. Abdomen is benign with no organomegaly or masses noted. Motor sensory and DTR levels are equal and symmetric in the upper and lower extremities. Cranial nerves II through XII are grossly intact. Proprioception is  intact. No peripheral adenopathy or edema is identified. No motor or sensory levels are noted. Crude visual fields are within normal range.  RADIOLOGY RESULTS: mammograms are reviewed and compatible with the above-stated findings  PLAN: present time patient continues to do well with no evidence of disease. I've asked to see her back in 1 year for follow-up. Patient knows to call with any concerns at any time.  I would like to take this opportunity to thank you for allowing me to participate in the care of your patient.Noreene Filbert, MD

## 2018-11-01 DIAGNOSIS — R202 Paresthesia of skin: Secondary | ICD-10-CM | POA: Insufficient documentation

## 2018-11-01 DIAGNOSIS — R200 Anesthesia of skin: Secondary | ICD-10-CM | POA: Insufficient documentation

## 2018-11-01 DIAGNOSIS — M79642 Pain in left hand: Secondary | ICD-10-CM | POA: Insufficient documentation

## 2019-02-06 ENCOUNTER — Encounter: Payer: Self-pay | Admitting: Podiatry

## 2019-02-06 ENCOUNTER — Other Ambulatory Visit: Payer: Self-pay

## 2019-02-06 ENCOUNTER — Ambulatory Visit: Payer: Medicaid Other | Admitting: Podiatry

## 2019-02-06 VITALS — Temp 98.4°F

## 2019-02-06 DIAGNOSIS — L84 Corns and callosities: Secondary | ICD-10-CM

## 2019-02-06 NOTE — Progress Notes (Signed)
This patient presents the office with chief complaint of painful calluses and cracks in the calluses on the tips of her third toe both feet.  She states that they have been present for approximately 3 to 4 months in the cracks have been worse.  She says she has been applying O'Keefe's to the tips of her toe and they have mildly improved.  She says there is no evidence of any drainage on the tips of any of these toes.  She presents the office today for an evaluation of these calluses and cracks on the tips of her third toe both feet.  Vascular  Dorsalis pedis and posterior tibial pulses are palpable  B/L.  Capillary return  WNL.  Temperature gradient is  WNL.  Skin turgor  WNL  Sensorium  Senn Weinstein monofilament wire  WNL. Normal tactile sensation.  Nail Exam  Patient has normal nails with no evidence of bacterial or fungal infection.  Orthopedic  Exam  Muscle tone and muscle strength  WNL.  No limitations of motion feet  B/L.  No crepitus or joint effusion noted.  Foot type is unremarkable and digits show no abnormalities.  HAV 1st MPJ right greater than left.    Skin  No open lesions.  Normal skin texture and turgor.  Distal callus tip of third toe  B/l  Distal callus 3rd toes  B/L.  IE.  Debride callus 3rd toe  B/L.  Told this patient to utilize a pumas stone as well as keep applying the O'Keefe's moisturizing cream.  Return to the clinic as needed   Gardiner Barefoot DPM

## 2019-02-07 ENCOUNTER — Other Ambulatory Visit: Payer: Self-pay | Admitting: Family Medicine

## 2019-02-07 DIAGNOSIS — D0512 Intraductal carcinoma in situ of left breast: Secondary | ICD-10-CM

## 2019-02-24 ENCOUNTER — Other Ambulatory Visit: Payer: Self-pay

## 2019-02-24 ENCOUNTER — Ambulatory Visit
Admission: RE | Admit: 2019-02-24 | Discharge: 2019-02-24 | Disposition: A | Discharge status: Home / Self Care | Payer: Medicaid Other | Source: Ambulatory Visit | Attending: Family Medicine | Admitting: Family Medicine

## 2019-02-24 DIAGNOSIS — D0512 Intraductal carcinoma in situ of left breast: Secondary | ICD-10-CM

## 2019-02-27 ENCOUNTER — Other Ambulatory Visit: Payer: Self-pay | Admitting: *Deleted

## 2019-02-27 DIAGNOSIS — D0512 Intraductal carcinoma in situ of left breast: Secondary | ICD-10-CM

## 2019-03-16 ENCOUNTER — Other Ambulatory Visit: Payer: Self-pay

## 2019-03-16 ENCOUNTER — Encounter: Payer: Self-pay | Admitting: General Surgery

## 2019-03-16 ENCOUNTER — Ambulatory Visit: Payer: Medicaid Other | Admitting: General Surgery

## 2019-03-16 VITALS — BP 152/92 | HR 80 | Temp 98.1°F | Ht 62.0 in | Wt 256.0 lb

## 2019-03-16 DIAGNOSIS — D0512 Intraductal carcinoma in situ of left breast: Secondary | ICD-10-CM

## 2019-03-16 NOTE — Patient Instructions (Signed)
The patient has been asked to return to the office in one year with a Bilateral Diagnostic mammogram.  Continue self breast exams. Call office for any new breast issues or concerns.

## 2019-03-16 NOTE — Progress Notes (Unsigned)
Patient ID: Robin Brooks, female   DOB: 10/03/1960, 58 y.o.   MRN: [id number]  Chief Complaint  Patient presents with  . Follow-up    mammogram     HPI Robin Brooks is a 58 y.o. female who presents for a breast evaluation. The most recent mammogram was done on 02/24/19. Patient does perform regular self breast checks and gets regular mammograms done. She reports no new breast problems.     HPI  Past Medical History:  Diagnosis Date  . Allergy   . Anemia   . Anxiety   . Arthritis   . Breast cancer ([place]) 2018   DCIS, High-grade, comedo necrosis ER/PR positive  . Colitis    gastric  . GERD (gastroesophageal reflux disease)   . Heart murmur    ASYMPTOMATIC  . History of methicillin resistant staphylococcus aureus (MRSA) 2013  . IBS (irritable bowel syndrome)   . Personal history of radiation therapy 2018   Left breast cancer  . Pneumonia 2003   H/O    Past Surgical History:  Procedure Laterality Date  . BREAST BIOPSY Left 03/02/2017   DCIS  . BREAST CYST EXCISION Left 80s   neg  . BREAST LUMPECTOMY Left 04/19/2017   Wide excision high grade DCIS, ER/PR positive.   Surgeon: Robert Bellow, MD;  Location: [facility] ORS;  Service: General;  Laterality: Left;  . BREAST MAMMOSITE Left 05/12/2017   Procedure: MAMMOSITE BREAST;  Surgeon: Robert Bellow, MD;  Location: [facility] ORS;  Service: General;  Laterality: Left;  . CESAREAN SECTION    . CHOLECYSTECTOMY    . FOOT SURGERY     left  . TUBAL LIGATION  1995    Family History  Problem Relation Age of Onset  . Other Father   . Breast cancer Neg Hx     Social History Social History   Tobacco Use  . Smoking status: Former Smoker    Packs/day: 0.25    Years: 35.00    Pack years: 8.75    Types: Cigarettes    Quit date: 05/18/2017    Years since quitting: 1.8  . Smokeless tobacco: Never Used  Substance Use Topics  . Alcohol use: No  . Drug use: No    No Known Allergies  Current Outpatient Medications   Medication Sig Dispense Refill  . aspirin 81 MG chewable tablet Chew by mouth daily.    . cetirizine (ZYRTEC) 10 MG tablet Take 10 mg by mouth daily as needed for allergies.    Marland Kitchen dicyclomine (BENTYL) 10 MG capsule Take by mouth.    Marland Kitchen FLUoxetine (PROZAC) 20 MG capsule Take 20 mg by mouth daily before breakfast.    . gabapentin (NEURONTIN) 100 MG capsule Take by mouth.    . loperamide (IMODIUM A-D) 2 MG tablet Take 2 mg by mouth as needed for diarrhea or loose stools.    . Melatonin 5 MG TABS Take 5 mg by mouth at bedtime as needed (sleep).    . meloxicam (MOBIC) 15 MG tablet Take 15 mg by mouth daily.    . Multiple Vitamin (MULTIVITAMIN WITH MINERALS) TABS tablet Take 1 tablet by mouth daily. Women's 50+    . naproxen sodium (ANAPROX) 220 MG tablet Take 220 mg by mouth daily.     Marland Kitchen omeprazole (PRILOSEC) 20 MG capsule Take 20 mg by mouth daily before breakfast.     . raloxifene (EVISTA) 60 MG tablet Take 1 tablet (60 mg total) by mouth daily. 90 tablet  3   No current facility-administered medications for this visit.     Review of Systems Review of Systems  Constitutional: Negative.   Respiratory: Negative.   Cardiovascular: Negative.     Blood pressure (!) 152/92, pulse 80, temperature 98.1 F (36.7 C), height 5\' 2"  (1.575 m), weight 256 lb (116.1 kg), last menstrual period 06/20/2015, SpO2 95 %.  Physical Exam Physical Exam Exam conducted with a chaperone present.  Constitutional:      Appearance: She is well-developed.  Eyes:     General: No scleral icterus.    Conjunctiva/sclera: Conjunctivae normal.  Neck:     Musculoskeletal: Neck supple.  Cardiovascular:     Rate and Rhythm: Normal rate and regular rhythm.     Heart sounds: Normal heart sounds.  Pulmonary:     Effort: Pulmonary effort is normal.     Breath sounds: Normal breath sounds.  Chest:     Breasts: Breasts are asymmetrical (right larger than left).        Right: No inverted nipple, mass, nipple discharge,  skin change or tenderness.        Left: No inverted nipple, mass, nipple discharge, skin change or tenderness.  Lymphadenopathy:     Cervical: No cervical adenopathy.     Upper Body:     Right upper body: No axillary adenopathy.     Left upper body: No axillary adenopathy.  Skin:    General: Skin is warm and dry.  Neurological:     Mental Status: She is alert and oriented to person, place, and time.  Psychiatric:        Mood and Affect: Mood normal.     Data Reviewed ***  Assessment ***  Plan  The patient has been asked to return to the office in one year with a Bilateral Diagnostic mammogram.  HPI, Physical Exam, Assessment and Plan have been scribed under the direction and in the presence of Robert Bellow, MD  Concepcion Living, LPN  Concepcion Living M 03/16/2019, 3:52 PM

## 2019-03-31 ENCOUNTER — Encounter: Payer: Self-pay | Admitting: General Surgery

## 2019-05-05 ENCOUNTER — Encounter: Payer: Self-pay | Admitting: *Deleted

## 2019-05-10 ENCOUNTER — Encounter
Admission: RE | Admit: 2019-05-10 | Discharge: 2019-05-10 | Disposition: A | Discharge status: Home / Self Care | Payer: Medicaid Other | Source: Ambulatory Visit | Attending: Surgery | Admitting: Surgery

## 2019-05-10 ENCOUNTER — Other Ambulatory Visit: Payer: Self-pay

## 2019-05-10 HISTORY — DX: Dyspnea, unspecified: R06.00

## 2019-05-10 NOTE — Patient Instructions (Signed)
Your procedure is scheduled on: 05/17/2019 Wed  Report to Same Day Surgery 2nd floor medical mall [facility] Entrance-take elevator on left to 2nd floor.  Check in with surgery information desk.) To find out your arrival time please call [phone] between 1PM - 3PM on 05/16/2019 Tues  Remember: Instructions that are not followed completely may result in serious medical risk, up to and including death, or upon the discretion of your surgeon and anesthesiologist your surgery may need to be rescheduled.    _x___ 1. Do not eat food after midnight the night before your procedure. You may drink clear liquids up to 2 hours before you are scheduled to arrive at the hospital for your procedure.  Do not drink clear liquids within 2 hours of your scheduled arrival to the hospital.  Clear liquids include  --Water or Apple juice without pulp  --Clear carbohydrate beverage such as ClearFast or Gatorade  --Black Coffee or Clear Tea (No milk, no creamers, do not add anything to                  the coffee or Tea Type 1 and type 2 diabetics should only drink water.   ____Ensure clear carbohydrate drink on the way to the hospital for bariatric patients  ____Ensure clear carbohydrate drink 3 hours before surgery.   No gum chewing or hard candies.     __x__ 2. No Alcohol for 24 hours before or after surgery.   __x__3. No Smoking or e-cigarettes for 24 prior to surgery.  Do not use any chewable tobacco products for at least 6 hour prior to surgery   ____  4. Bring all medications with you on the day of surgery if instructed.    __x__ 5. Notify your doctor if there is any change in your medical condition     (cold, fever, infections).    x___6. On the morning of surgery brush your teeth with toothpaste and water.  You may rinse your mouth with mouth wash if you wish.  Do not swallow any toothpaste or mouthwash.   Do not wear jewelry, make-up, hairpins, clips or nail polish.  Do not wear lotions,  powders, or perfumes. You may wear deodorant.  Do not shave 48 hours prior to surgery. Men may shave face and neck.  Do not bring valuables to the hospital.    [facility] is not responsible for any belongings or valuables.               Contacts, dentures or bridgework may not be worn into surgery.  Leave your suitcase in the car. After surgery it may be brought to your room.  For patients admitted to the hospital, discharge time is determined by your                       treatment team.  _  Patients discharged the day of surgery will not be allowed to drive home.  You will need someone to drive you home and stay with you the night of your procedure.    Please read over the following fact sheets that you were given:   [facility] Preparing for Surgery and or MRSA Information   _x___ Take anti-hypertensive listed below, cardiac, seizure, asthma,     anti-reflux and psychiatric medicines. These include:  1. cetirizine (ZYRTEC) 10 MG tablet  2.FLUoxetine (PROZAC) 20 MG capsule  3.omeprazole (PRILOSEC) 20 MG capsule  4.raloxifene (EVISTA) 60 MG tablet  5.  6.  ____Fleets enema or Magnesium Citrate as directed.   _x___ Use CHG Soap or sage wipes as directed on instruction sheet   ____ Use inhalers on the day of surgery and bring to hospital day of surgery  ____ Stop Metformin and Janumet 2 days prior to surgery.    ____ Take 1/2 of usual insulin dose the night before surgery and none on the morning     surgery.   _x___ Follow recommendations from Cardiologist, Pulmonologist or PCP regarding          stopping Aspirin, Coumadin, Plavix ,Eliquis, Effient, or Pradaxa, and Pletal.  X____Stop Anti-inflammatories such as Advil, Aleve, Ibuprofen, Motrin, Naproxen, Naprosyn, Goodies powders or aspirin products. OK to take Tylenol and    Celebrex.  Stop Meloxicam on 05/11/2019   _x___ Stop supplements until after surgery.  But may continue Vitamin D, Vitamin B,       and  multivitamin.   ____ Bring C-Pap to the hospital.

## 2019-05-12 ENCOUNTER — Encounter
Admission: RE | Admit: 2019-05-12 | Discharge: 2019-05-12 | Disposition: A | Discharge status: Home / Self Care | Payer: Medicaid Other | Source: Ambulatory Visit | Attending: Surgery | Admitting: Surgery

## 2019-05-12 ENCOUNTER — Other Ambulatory Visit: Payer: Medicaid Other

## 2019-05-12 ENCOUNTER — Other Ambulatory Visit: Payer: Self-pay

## 2019-05-12 DIAGNOSIS — R0609 Other forms of dyspnea: Secondary | ICD-10-CM | POA: Diagnosis not present

## 2019-05-12 DIAGNOSIS — Z20828 Contact with and (suspected) exposure to other viral communicable diseases: Secondary | ICD-10-CM | POA: Diagnosis not present

## 2019-05-12 DIAGNOSIS — Z01812 Encounter for preprocedural laboratory examination: Secondary | ICD-10-CM | POA: Insufficient documentation

## 2019-05-12 LAB — CBC
HCT: 38 % (ref 36.0–46.0)
Hemoglobin: 12 g/dL (ref 12.0–15.0)
MCH: 25.5 pg — ABNORMAL LOW (ref 26.0–34.0)
MCHC: 31.6 g/dL (ref 30.0–36.0)
MCV: 80.7 fL (ref 80.0–100.0)
Platelets: 253 10*3/uL (ref 150–400)
RBC: 4.71 MIL/uL (ref 3.87–5.11)
RDW: 14.2 % (ref 11.5–15.5)
WBC: 5.4 10*3/uL (ref 4.0–10.5)
nRBC: 0 % (ref 0.0–0.2)

## 2019-05-12 LAB — BASIC METABOLIC PANEL
Anion gap: 8 (ref 5–15)
BUN: 13 mg/dL (ref 6–20)
CO2: 25 mmol/L (ref 22–32)
Calcium: 8.9 mg/dL (ref 8.9–10.3)
Chloride: 107 mmol/L (ref 98–111)
Creatinine, Ser: 0.66 mg/dL (ref 0.44–1.00)
GFR calc Af Amer: >60 mL/min (ref >60)
GFR calc non Af Amer: >60 mL/min (ref >60)
Glucose, Bld: 100 mg/dL — ABNORMAL HIGH (ref 70–99)
Potassium: 3.7 mmol/L (ref 3.5–5.1)
Sodium: 140 mmol/L (ref 135–145)

## 2019-05-13 LAB — SARS CORONAVIRUS 2 (TAT 6-24 HRS): SARS Coronavirus 2: NEGATIVE

## 2019-05-17 ENCOUNTER — Ambulatory Visit: Payer: Medicaid Other | Admitting: Certified Registered"

## 2019-05-17 ENCOUNTER — Encounter
Admission: RE | Disposition: A | Discharge status: Home / Self Care | Payer: Self-pay | Source: Home / Self Care | Attending: Surgery

## 2019-05-17 ENCOUNTER — Ambulatory Visit
Admission: RE | Admit: 2019-05-17 | Discharge: 2019-05-17 | Disposition: A | Discharge status: Home / Self Care | Payer: Medicaid Other | Attending: Surgery | Admitting: Surgery

## 2019-05-17 ENCOUNTER — Other Ambulatory Visit: Payer: Self-pay

## 2019-05-17 ENCOUNTER — Encounter: Payer: Self-pay | Admitting: *Deleted

## 2019-05-17 DIAGNOSIS — Z79899 Other long term (current) drug therapy: Secondary | ICD-10-CM | POA: Diagnosis not present

## 2019-05-17 DIAGNOSIS — Z87891 Personal history of nicotine dependence: Secondary | ICD-10-CM | POA: Diagnosis not present

## 2019-05-17 DIAGNOSIS — Z923 Personal history of irradiation: Secondary | ICD-10-CM | POA: Insufficient documentation

## 2019-05-17 DIAGNOSIS — Z6841 Body Mass Index (BMI) 40.0 and over, adult: Secondary | ICD-10-CM | POA: Insufficient documentation

## 2019-05-17 DIAGNOSIS — G5602 Carpal tunnel syndrome, left upper limb: Secondary | ICD-10-CM | POA: Insufficient documentation

## 2019-05-17 DIAGNOSIS — Z79810 Long term (current) use of selective estrogen receptor modulators (SERMs): Secondary | ICD-10-CM | POA: Diagnosis not present

## 2019-05-17 DIAGNOSIS — Z8614 Personal history of Methicillin resistant Staphylococcus aureus infection: Secondary | ICD-10-CM | POA: Insufficient documentation

## 2019-05-17 DIAGNOSIS — D0512 Intraductal carcinoma in situ of left breast: Secondary | ICD-10-CM | POA: Insufficient documentation

## 2019-05-17 DIAGNOSIS — D509 Iron deficiency anemia, unspecified: Secondary | ICD-10-CM | POA: Diagnosis not present

## 2019-05-17 HISTORY — PX: CARPAL TUNNEL RELEASE: SHX101

## 2019-05-17 SURGERY — RELEASE, CARPAL TUNNEL, ENDOSCOPIC
Anesthesia: General | Site: Wrist | Laterality: Left

## 2019-05-17 MED ORDER — ONDANSETRON HCL 4 MG/2ML IJ SOLN
INTRAMUSCULAR | Status: AC
  Filled 2019-05-17: qty 2

## 2019-05-17 MED ORDER — CEFAZOLIN SODIUM-DEXTROSE 2-4 GM/100ML-% IV SOLN
2.0000 g | Freq: Once | INTRAVENOUS | Status: AC
  Administered 2019-05-17: 10:00:00 2 g via INTRAVENOUS

## 2019-05-17 MED ORDER — DEXAMETHASONE SODIUM PHOSPHATE 10 MG/ML IJ SOLN
INTRAMUSCULAR | Status: DC | PRN
  Administered 2019-05-17: 10 mg via INTRAVENOUS

## 2019-05-17 MED ORDER — CEFAZOLIN SODIUM-DEXTROSE 2-4 GM/100ML-% IV SOLN
INTRAVENOUS | Status: AC
  Filled 2019-05-17: qty 100

## 2019-05-17 MED ORDER — SUCCINYLCHOLINE CHLORIDE 20 MG/ML IJ SOLN
INTRAMUSCULAR | Status: DC | PRN
  Administered 2019-05-17: 120 mg via INTRAVENOUS

## 2019-05-17 MED ORDER — LIDOCAINE HCL (PF) 2 % IJ SOLN
INTRAMUSCULAR | Status: AC
  Filled 2019-05-17: qty 10

## 2019-05-17 MED ORDER — BUPIVACAINE HCL (PF) 0.5 % IJ SOLN
INTRAMUSCULAR | Status: DC | PRN
  Administered 2019-05-17: 10 mL

## 2019-05-17 MED ORDER — PROPOFOL 10 MG/ML IV BOLUS
INTRAVENOUS | Status: AC
  Filled 2019-05-17: qty 40

## 2019-05-17 MED ORDER — FAMOTIDINE 20 MG PO TABS
20.0000 mg | ORAL_TABLET | Freq: Once | ORAL | Status: AC
  Administered 2019-05-17: 09:00:00 20 mg via ORAL

## 2019-05-17 MED ORDER — MIDAZOLAM HCL 2 MG/2ML IJ SOLN
INTRAMUSCULAR | Status: DC | PRN
  Administered 2019-05-17: 2 mg via INTRAVENOUS

## 2019-05-17 MED ORDER — LACTATED RINGERS IV SOLN
INTRAVENOUS | Status: DC
  Administered 2019-05-17: 09:00:00 via INTRAVENOUS

## 2019-05-17 MED ORDER — PHENYLEPHRINE HCL (PRESSORS) 10 MG/ML IV SOLN
INTRAVENOUS | Status: DC | PRN
  Administered 2019-05-17 (×4): 200 ug via INTRAVENOUS

## 2019-05-17 MED ORDER — FENTANYL CITRATE (PF) 100 MCG/2ML IJ SOLN
INTRAMUSCULAR | Status: DC | PRN
  Administered 2019-05-17 (×2): 50 ug via INTRAVENOUS

## 2019-05-17 MED ORDER — LIDOCAINE HCL (CARDIAC) PF 100 MG/5ML IV SOSY
PREFILLED_SYRINGE | INTRAVENOUS | Status: DC | PRN
  Administered 2019-05-17: 100 mg via INTRAVENOUS

## 2019-05-17 MED ORDER — MIDAZOLAM HCL 2 MG/2ML IJ SOLN
INTRAMUSCULAR | Status: AC
  Filled 2019-05-17: qty 2

## 2019-05-17 MED ORDER — SUCCINYLCHOLINE CHLORIDE 20 MG/ML IJ SOLN
INTRAMUSCULAR | Status: AC
  Filled 2019-05-17: qty 1

## 2019-05-17 MED ORDER — PROMETHAZINE HCL 25 MG/ML IJ SOLN: 6.2500 mg | INTRAMUSCULAR | Status: DC | PRN

## 2019-05-17 MED ORDER — DEXAMETHASONE SODIUM PHOSPHATE 10 MG/ML IJ SOLN
INTRAMUSCULAR | Status: AC
  Filled 2019-05-17: qty 1

## 2019-05-17 MED ORDER — FENTANYL CITRATE (PF) 100 MCG/2ML IJ SOLN
INTRAMUSCULAR | Status: AC
  Filled 2019-05-17: qty 2

## 2019-05-17 MED ORDER — FAMOTIDINE 20 MG PO TABS
ORAL_TABLET | ORAL | Status: AC
  Administered 2019-05-17: 09:00:00 20 mg via ORAL
  Filled 2019-05-17: qty 1

## 2019-05-17 MED ORDER — PROPOFOL 10 MG/ML IV BOLUS
INTRAVENOUS | Status: DC | PRN
  Administered 2019-05-17: 50 mg via INTRAVENOUS
  Administered 2019-05-17: 200 mg via INTRAVENOUS

## 2019-05-17 MED ORDER — FENTANYL CITRATE (PF) 100 MCG/2ML IJ SOLN: 25.0000 ug | INTRAMUSCULAR | Status: DC | PRN

## 2019-05-17 MED ORDER — BUPIVACAINE HCL (PF) 0.5 % IJ SOLN
INTRAMUSCULAR | Status: AC
  Filled 2019-05-17: qty 30

## 2019-05-17 MED ORDER — ONDANSETRON HCL 4 MG/2ML IJ SOLN
INTRAMUSCULAR | Status: DC | PRN
  Administered 2019-05-17: 4 mg via INTRAVENOUS

## 2019-05-17 SURGICAL SUPPLY — 31 items
BNDG COHESIVE 4X5 TAN STRL (GAUZE/BANDAGES/DRESSINGS) ×3 IMPLANT
BNDG ELASTIC 2X5.8 VLCR STR LF (GAUZE/BANDAGES/DRESSINGS) ×3 IMPLANT
BNDG ESMARK 4X12 TAN STRL LF (GAUZE/BANDAGES/DRESSINGS) ×3 IMPLANT
CANISTER SUCT 1200ML W/VALVE (MISCELLANEOUS) ×3 IMPLANT
CHLORAPREP W/TINT 26 (MISCELLANEOUS) ×3 IMPLANT
CORD BIP STRL DISP 12FT (MISCELLANEOUS) ×3 IMPLANT
COVER WAND RF STERILE (DRAPES) ×3 IMPLANT
CUFF TOURN 18 STER (MISCELLANEOUS) IMPLANT
CUFF TOURN SGL QUICK 24 (TOURNIQUET CUFF) ×2
CUFF TRNQT CYL 24X4X16.5-23 (TOURNIQUET CUFF) ×1 IMPLANT
DRAPE SURG 17X11 SM STRL (DRAPES) ×3 IMPLANT
FORCEPS JEWEL BIP 4-3/4 STR (INSTRUMENTS) ×3 IMPLANT
GAUZE SPONGE 4X4 12PLY STRL (GAUZE/BANDAGES/DRESSINGS) ×3 IMPLANT
GAUZE XEROFORM 1X8 LF (GAUZE/BANDAGES/DRESSINGS) ×3 IMPLANT
GLOVE BIO SURGEON STRL SZ8 (GLOVE) ×3 IMPLANT
GLOVE INDICATOR 8.0 STRL GRN (GLOVE) ×3 IMPLANT
GOWN STRL REUS W/ TWL LRG LVL3 (GOWN DISPOSABLE) ×1 IMPLANT
GOWN STRL REUS W/ TWL XL LVL3 (GOWN DISPOSABLE) ×1 IMPLANT
GOWN STRL REUS W/TWL LRG LVL3 (GOWN DISPOSABLE) ×2
GOWN STRL REUS W/TWL XL LVL3 (GOWN DISPOSABLE) ×2
KIT CARPAL TUNNEL (MISCELLANEOUS) ×2
KIT ESCP INSRT D SLOT CANN KN (MISCELLANEOUS) ×1 IMPLANT
KIT TURNOVER KIT A (KITS) ×3 IMPLANT
NS IRRIG 500ML POUR BTL (IV SOLUTION) ×3 IMPLANT
PACK EXTREMITY ARMC (MISCELLANEOUS) ×3 IMPLANT
SPLINT WRIST LG LT TX990309 (SOFTGOODS) ×3 IMPLANT
SPLINT WRIST LG RT TX900304 (SOFTGOODS) IMPLANT
SPLINT WRIST M LT TX990308 (SOFTGOODS) IMPLANT
SPLINT WRIST M RT TX990303 (SOFTGOODS) IMPLANT
STOCKINETTE IMPERVIOUS 9X36 MD (GAUZE/BANDAGES/DRESSINGS) ×3 IMPLANT
SUT PROLENE 4 0 PS 2 18 (SUTURE) ×3 IMPLANT

## 2019-05-17 NOTE — Anesthesia Procedure Notes (Signed)
Procedure Name: Intubation Date/Time: 05/17/2019 9:48 AM Performed by: Lavone Orn, CRNA Pre-anesthesia Checklist: Patient identified, Emergency Drugs available, Suction available, Patient being monitored and Timeout performed Patient Re-evaluated:Patient Re-evaluated prior to induction Oxygen Delivery Method: Circle system utilized Preoxygenation: Pre-oxygenation with 100% oxygen Induction Type: IV induction Ventilation: Mask ventilation without difficulty Laryngoscope Size: Mac and 4 Grade View: Grade I Tube type: Oral Tube size: 7.0 mm Number of attempts: 1 Airway Equipment and Method: Stylet Placement Confirmation: ETT inserted through vocal cords under direct vision,  positive ETCO2 and breath sounds checked- equal and bilateral Secured at: 21 cm Tube secured with: Tape Dental Injury: Teeth and Oropharynx as per pre-operative assessment

## 2019-05-17 NOTE — Transfer of Care (Signed)
Immediate Anesthesia Transfer of Care Note  Patient: Robin Brooks  Procedure(s) Performed: CARPAL TUNNEL RELEASE ENDOSCOPIC (Left Wrist)  Patient Location: PACU  Anesthesia Type:General  Level of Consciousness: awake and drowsy  Airway & Oxygen Therapy: Patient Spontanous Breathing and Patient connected to face mask oxygen  Post-op Assessment: Report given to RN and Post -op Vital signs reviewed and stable  Post vital signs: stable  Last Vitals:  Vitals Value Taken Time  BP 126/48 05/17/19 1030  Temp 36.4 C 05/17/19 1030  Pulse 68 05/17/19 1032  Resp 12 05/17/19 1032  SpO2 100 % 05/17/19 1032  Vitals shown include unvalidated device data.  Last Pain:  Vitals:   05/17/19 1030  TempSrc:   PainSc: 0-No pain         Complications: No apparent anesthesia complications

## 2019-05-17 NOTE — Anesthesia Preprocedure Evaluation (Signed)
Anesthesia Evaluation  Patient identified by MRN, date of birth, ID band Patient awake    Reviewed: Allergy & Precautions, H&P , NPO status , Patient's Chart, lab work & pertinent test results, reviewed documented beta blocker date and time   History of Anesthesia Complications Negative for: history of anesthetic complications  Airway Mallampati: II  TM Distance: >3 FB Neck ROM: full    Dental  (+) Dental Advidsory Given, Missing, Poor Dentition   Pulmonary neg pulmonary ROS, former smoker,    Pulmonary exam normal        Cardiovascular Exercise Tolerance: Good (-) hypertension(-) angina(-) Past MI and (-) Cardiac Stents Normal cardiovascular exam(-) dysrhythmias + Valvular Problems/Murmurs      Neuro/Psych PSYCHIATRIC DISORDERS Anxiety negative neurological ROS     GI/Hepatic Neg liver ROS, GERD  ,  Endo/Other  neg diabetesMorbid obesity  Renal/GU negative Renal ROS  negative genitourinary   Musculoskeletal   Abdominal   Peds  Hematology negative hematology ROS (+)   Anesthesia Other Findings Past Medical History: No date: Allergy No date: Anemia No date: Anxiety No date: Arthritis 2018: 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, High-grade, comedo necrosis ER/PR positive No date: Colitis     Comment:  gastric No date: Dyspnea No date: GERD (gastroesophageal reflux disease) No date: Heart murmur     Comment:  ASYMPTOMATIC 2013: History of methicillin resistant staphylococcus aureus (MRSA) No date: IBS (irritable bowel syndrome) 2018: Personal history of radiation therapy     Comment:  Left breast cancer 2003: Pneumonia     Comment:  H/O   Reproductive/Obstetrics negative OB ROS                             Anesthesia Physical Anesthesia Plan  ASA: III  Anesthesia Plan: General   Post-op Pain Management:    Induction: Intravenous  PONV Risk Score and Plan: 3 and  Ondansetron, Dexamethasone, Midazolam and Treatment may vary due to age or medical condition  Airway Management Planned: Oral ETT  Additional Equipment:   Intra-op Plan:   Post-operative Plan: Extubation in OR  Informed Consent: I have reviewed the patients History and Physical, chart, labs and discussed the procedure including the risks, benefits and alternatives for the proposed anesthesia with the patient or authorized representative who has indicated his/her understanding and acceptance.     Dental Advisory Given  Plan Discussed with: Anesthesiologist, CRNA and Surgeon  Anesthesia Plan Comments:         Anesthesia Quick Evaluation

## 2019-05-17 NOTE — H&P (Signed)
Paper H&P to be scanned into permanent record. H&P reviewed and patient re-examined. No changes. 

## 2019-05-17 NOTE — Anesthesia Post-op Follow-up Note (Signed)
Anesthesia QCDR form completed.        

## 2019-05-17 NOTE — Discharge Instructions (Addendum)
Orthopedic discharge instructions: Keep dressing dry and intact. Keep hand elevated above heart level. May shower after dressing removed on postop day 4 (Sunday). Cover sutures with Band-Aids after drying off. Apply ice to affected area frequently. Take meloxicam 15 mg daily OR ibuprofen 600-800 mg TID with meals for 7-10 days, then as necessary. Take ES Tylenol when needed.  Return for follow-up in 10-14 days or as scheduled.  AMBULATORY SURGERY  DISCHARGE INSTRUCTIONS   1) The drugs that you were given will stay in your system until tomorrow so for the next 24 hours you should not:  A) Drive an automobile B) Make any legal decisions C) Drink any alcoholic beverage   2) You may resume regular meals tomorrow.  Today it is better to start with liquids and gradually work up to solid foods.  You may eat anything you prefer, but it is better to start with liquids, then soup and crackers, and gradually work up to solid foods.   3) Please notify your doctor immediately if you have any unusual bleeding, trouble breathing, redness and pain at the surgery site, drainage, fever, or pain not relieved by medication.    4) Additional Instructions:        Please contact your physician with any problems or Same Day Surgery at [phone], Monday through Friday 6 am to 4 pm, or [place] at [facility] number at [phone].

## 2019-05-17 NOTE — Op Note (Signed)
05/17/2019  10:22 AM  Patient:   Robin Brooks  Pre-Op Diagnosis:   Left carpal tunnel syndrome.  Post-Op Diagnosis:   Same.  Procedure:   Endoscopic left carpal tunnel release.  Surgeon:   Pascal Lux, MD  Anesthesia:   GET  Findings:   As above.  Complications:   None  EBL:   0 cc  Fluids:   500 cc crystalloid  TT:   13 minutes at 250 mmHg  Drains:   None  Closure:   4-0 Prolene interrupted sutures  Brief Clinical Note:   The patient is a 58 year old female with a long history of progressively worsening pain and paresthesias to her left hand. Her symptoms have persisted despite medications, activity modification, splinting, etc. Her history and examination are consistent with carpal tunnel syndrome, confirmed by EMG. The patient presents at this time for an endoscopic left carpal tunnel release.   Procedure:   The patient was brought into the operating room and lain in the supine position. After adequate IV sedation was achieved, a timeout was performed to verify the appropriate surgical site before a Bier block was placed by the anesthesiologist and the tourniquet inflated to 250 mmHg. The left hand and upper extremity were prepped with ChloraPrep solution before being draped sterilely. Preoperative antibiotics were administered. An approximately 1.5-2 cm incision was made over the volar wrist flexion crease, centered over the palmaris longus tendon. The incision was carried down through the subcutaneous tissues with care taken to identify and protect any neurovascular structures. The distal forearm fascia was penetrated just proximal to the transverse carpal ligament. The soft tissues were released off the superficial and deep surfaces of the distal forearm fascia and this was released proximally for 3-4 cm under direct visualization.  Attention was directed distally. The Soil scientist was passed beneath the transverse carpal ligament along the ulnar aspect of the carpal  tunnel and used to release any adhesions as well as to remove any adherent synovial tissue before first the smaller then the larger of the two dilators were passed beneath the transverse carpal ligament along the ulnar margin of the carpal tunnel. The slotted cannula was introduced and the endoscope was placed into the slotted cannula and the undersurface of the transverse carpal ligament visualized. The distal margin of the transverse carpal ligament was marked by placing a 25-gauge needle percutaneously at [place] cardinal point so that it entered the distal portion of the slotted cannula. Under endoscopic visualization, the transverse carpal ligament was released from proximal to distal using the end-cutting blade. A second pass was performed to ensure complete release of the ligament. The adequacy of release was verified both endoscopically and by palpation using the freer elevator.  The wound was irrigated thoroughly with sterile saline solution before being closed using 4-0 Prolene interrupted sutures. A total of 10 cc of 0.5% plain Sensorcaine was injected in and around the incision before a sterile bulky dressing was applied to the wound. The patient was placed into a volar wrist splint before being awakened and returned to the recovery room in satisfactory condition after tolerating the procedure well.

## 2019-05-18 NOTE — Anesthesia Postprocedure Evaluation (Signed)
Anesthesia Post Note  Patient: Robin Brooks  Procedure(s) Performed: CARPAL TUNNEL RELEASE ENDOSCOPIC (Left Wrist)  Patient location during evaluation: PACU Anesthesia Type: General Level of consciousness: awake and alert Pain management: pain level controlled Vital Signs Assessment: post-procedure vital signs reviewed and stable Respiratory status: spontaneous breathing, nonlabored ventilation, respiratory function stable and patient connected to nasal cannula oxygen Cardiovascular status: blood pressure returned to baseline and stable Postop Assessment: no apparent nausea or vomiting Anesthetic complications: no     Last Vitals:  Vitals:   05/17/19 1110 05/17/19 1115  BP: 127/75 (!) 142/68  Pulse: 70 70  Resp: 20 18  Temp: 36.8 C 37 C  SpO2: 97% (!) 9%    Last Pain:  Vitals:   05/17/19 1115  TempSrc: Temporal  PainSc: 0-No pain                 Martha Clan

## 2019-07-06 ENCOUNTER — Ambulatory Visit: Payer: Self-pay | Admitting: Radiation Oncology

## 2019-07-12 ENCOUNTER — Ambulatory Visit: Payer: Medicaid Other | Attending: Radiation Oncology | Admitting: Radiation Oncology

## 2019-11-10 ENCOUNTER — Other Ambulatory Visit: Payer: Self-pay

## 2019-11-10 ENCOUNTER — Ambulatory Visit
Admission: EM | Admit: 2019-11-10 | Discharge: 2019-11-10 | Disposition: A | Discharge status: Home / Self Care | Payer: Medicaid Other | Attending: Family Medicine | Admitting: Family Medicine

## 2019-11-10 ENCOUNTER — Encounter: Payer: Self-pay | Admitting: Emergency Medicine

## 2019-11-10 DIAGNOSIS — R251 Tremor, unspecified: Secondary | ICD-10-CM | POA: Diagnosis not present

## 2019-11-10 LAB — BASIC METABOLIC PANEL
Anion gap: 5 (ref 5–15)
BUN: 10 mg/dL (ref 6–20)
CO2: 28 mmol/L (ref 22–32)
Calcium: 8.8 mg/dL — ABNORMAL LOW (ref 8.9–10.3)
Chloride: 102 mmol/L (ref 98–111)
Creatinine, Ser: 0.65 mg/dL (ref 0.44–1.00)
GFR calc Af Amer: >60 mL/min (ref >60)
GFR calc non Af Amer: >60 mL/min (ref >60)
Glucose, Bld: 126 mg/dL — ABNORMAL HIGH (ref 70–99)
Potassium: 3.5 mmol/L (ref 3.5–5.1)
Sodium: 135 mmol/L (ref 135–145)

## 2019-11-10 NOTE — Discharge Instructions (Signed)
Monitor blood pressure and blood sugars Follow up with Primary Care provider

## 2019-11-10 NOTE — ED Triage Notes (Signed)
Patient states that she started shaking this morning while at work.  Patient states that she was eating a snack. Patient states that she has mentioned this to her PCP because she states that she has episodes of this off and on for couple of months.  Patient denies fevers.

## 2019-11-11 NOTE — ED Provider Notes (Signed)
[facility]    CSN: [id number] Arrival date & time: 11/10/19  1019      History   Chief Complaint Chief Complaint  Patient presents with  . Shaking    HPI Robin Brooks is a 59 y.o. female.   59 yo female with a c/o an episode of shaking this morning while at work. States she ate a snack and currently feels fine. Denies any fevers, chills, chest pains, shortness of breath, numbness/tingling.  States she's had similar episodes on and off for the past few months.      Past Medical History:  Diagnosis Date  . Allergy   . Anemia   . Anxiety   . Arthritis   . Breast cancer ([place]) 2018   DCIS, High-grade, comedo necrosis ER/PR positive  . Colitis    gastric  . Dyspnea   . GERD (gastroesophageal reflux disease)   . Heart murmur    ASYMPTOMATIC  . History of methicillin resistant staphylococcus aureus (MRSA) 2013  . IBS (irritable bowel syndrome)   . Personal history of radiation therapy 2018   Left breast cancer  . Pneumonia 2003   H/O    Patient Active Problem List   Diagnosis Date Noted  . Left hand pain 11/01/2018  . Numbness and tingling in left hand 11/01/2018  . Morbid obesity with BMI of 45.0-49.9, adult ([place]) 09/20/2018  . 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 IDA (iron deficiency anemia) 02/27/2014  . Allergic rhinitis 01/30/2014  . GERD (gastroesophageal reflux disease) 01/30/2014  . IBS (irritable bowel syndrome) 01/30/2014    Past Surgical History:  Procedure Laterality Date  . BREAST BIOPSY Left 03/02/2017   DCIS  . BREAST CYST EXCISION Left 80s   neg  . BREAST LUMPECTOMY Left 04/19/2017   Wide excision high grade DCIS, ER/PR positive.   Surgeon: Robert Bellow, MD;  Location: [facility] ORS;  Service: General;  Laterality: Left;  . BREAST MAMMOSITE Left 05/12/2017   Procedure: MAMMOSITE BREAST;  Surgeon: Robert Bellow, MD;  Location: [facility] ORS;  Service: General;  Laterality: Left;  . CARPAL TUNNEL RELEASE  Left 05/17/2019   Procedure: CARPAL TUNNEL RELEASE ENDOSCOPIC;  Surgeon: Corky Mull, MD;  Location: [facility] ORS;  Service: Orthopedics;  Laterality: Left;  . CESAREAN SECTION    . CHOLECYSTECTOMY    . FOOT SURGERY     left  . TUBAL LIGATION  1995    OB History    Gravida  1   Para      Term      Preterm      AB      Living  1     SAB      TAB      Ectopic      Multiple      Live Births  1        Obstetric Comments  1st Menstrual Cycle:  12 1st Pregnancy: 30          Home Medications    Prior to Admission medications   Medication Sig Start Date End Date Taking? Authorizing Provider  cetirizine (ZYRTEC) 10 MG tablet Take 10 mg by mouth daily.    Yes [provider]  FLUoxetine (PROZAC) 20 MG capsule Take 20 mg by mouth daily before breakfast.   Yes [provider]  Melatonin 5 MG TABS Take 5 mg by mouth at bedtime as needed (sleep).   Yes [provider]  omeprazole (PRILOSEC) 20 MG capsule Take 20 mg by mouth daily before breakfast.    Yes [provider]  raloxifene (EVISTA) 60 MG tablet Take 1 tablet (60 mg total) by mouth daily. 07/29/17  Yes Byrnett, Forest Gleason, MD  Vitamin Mixture (ESTER-C PO) Take 500 mg by mouth daily.   Yes [provider]  gabapentin (NEURONTIN) 100 MG capsule Take by mouth.    [provider]  meloxicam (MOBIC) 15 MG tablet Take 15 mg by mouth daily. 01/07/19   [provider]    Family History Family History  Problem Relation Age of Onset  . Other Father   . Breast cancer Neg Hx     Social History Social History   Tobacco Use  . Smoking status: Former Smoker    Packs/day: 0.25    Years: 35.00    Pack years: 8.75    Types: Cigarettes    Quit date: 05/18/2017    Years since quitting: 2.4  . Smokeless tobacco: Never Used  Substance Use Topics  . Alcohol use: No  . Drug use: No     Allergies   Patient has no known allergies.   Review of  Systems Review of Systems   Physical Exam Triage Vital Signs ED Triage Vitals  Enc Vitals Group     BP 11/10/19 1041 130/69     Pulse Rate 11/10/19 1041 81     Resp 11/10/19 1041 14     Temp 11/10/19 1041 98 F (36.7 C)     Temp Source 11/10/19 1041 Oral     SpO2 11/10/19 1041 97 %     Weight 11/10/19 1036 260 lb (117.9 kg)     Height 11/10/19 1036 5\' 2"  (1.575 m)     Head Circumference --      Peak Flow --      Pain Score 11/10/19 1036 0     Pain Loc --      Pain Edu? --      Excl. in [place]? --    No data found.  Updated Vital Signs BP 130/69 (BP Location: Right Arm)   Pulse 81   Temp 98 F (36.7 C) (Oral)   Resp 14   Ht 5\' 2"  (1.575 m)   Wt 117.9 kg   LMP 06/20/2015 (Approximate)   SpO2 97%   BMI 47.55 kg/m   Visual Acuity Right Eye Distance:   Left Eye Distance:   Bilateral Distance:    Right Eye Near:   Left Eye Near:    Bilateral Near:     Physical Exam Vitals and nursing note reviewed.  Constitutional:      General: She is not in acute distress.    Appearance: She is not toxic-appearing or diaphoretic.  Eyes:     Extraocular Movements: Extraocular movements intact.     Pupils: Pupils are equal, round, and reactive to light.  Cardiovascular:     Rate and Rhythm: Normal rate.     Heart sounds: Normal heart sounds.  Pulmonary:     Effort: Pulmonary effort is normal. No respiratory distress.     Breath sounds: Normal breath sounds.  Neurological:     Mental Status: She is alert.      UC Treatments / Results  Labs (all labs ordered are listed, but only abnormal results are displayed) Labs Reviewed  BASIC METABOLIC PANEL - Abnormal; Notable for the following components:      Result Value   Glucose, Bld 126 (*)  Calcium 8.8 (*)    All other components within normal limits    EKG   Radiology No results found.  Procedures Procedures (including critical care time)  Medications Ordered in UC Medications - No data to display  Initial  Impression / Assessment and Plan / UC Course  I have reviewed the triage vital signs and the nursing notes.  Pertinent labs & imaging results that were available during my care of the patient were reviewed by me and considered in my medical decision making (see chart for details).     Final Clinical Impressions(s) / UC Diagnoses   Final diagnoses:  Episode of shaking     Discharge Instructions     Monitor blood pressure and blood sugars Follow up with Primary Care provider    ED Prescriptions    None      1. Lab results and diagnosis reviewed with patient 2. Recommend supportive treatment as above 3. Follow-up prn  PDMP not reviewed this encounter.   Norval Gable, MD 11/11/19 [id number]

## 2020-01-17 ENCOUNTER — Other Ambulatory Visit: Payer: Self-pay | Admitting: General Surgery

## 2020-01-17 DIAGNOSIS — D0512 Intraductal carcinoma in situ of left breast: Secondary | ICD-10-CM

## 2020-02-28 ENCOUNTER — Ambulatory Visit
Admission: RE | Admit: 2020-02-28 | Discharge: 2020-02-28 | Disposition: A | Discharge status: Home / Self Care | Payer: Medicaid Other | Source: Ambulatory Visit | Attending: General Surgery | Admitting: General Surgery

## 2020-02-28 DIAGNOSIS — D0512 Intraductal carcinoma in situ of left breast: Secondary | ICD-10-CM

## 2020-05-01 ENCOUNTER — Other Ambulatory Visit: Payer: Self-pay | Admitting: General Surgery

## 2020-05-01 DIAGNOSIS — D0512 Intraductal carcinoma in situ of left breast: Secondary | ICD-10-CM

## 2020-05-13 ENCOUNTER — Other Ambulatory Visit: Payer: Self-pay

## 2020-05-13 ENCOUNTER — Ambulatory Visit
Admission: EM | Admit: 2020-05-13 | Discharge: 2020-05-13 | Discharge status: Absent without leave | Payer: Medicaid Other

## 2020-05-13 ENCOUNTER — Other Ambulatory Visit: Payer: Medicaid Other

## 2020-05-13 DIAGNOSIS — Z20822 Contact with and (suspected) exposure to covid-19: Secondary | ICD-10-CM

## 2020-05-14 LAB — SARS-COV-2, NAA 2 DAY TAT

## 2020-05-14 LAB — NOVEL CORONAVIRUS, NAA: SARS-CoV-2, NAA: NOT DETECTED

## 2020-05-16 ENCOUNTER — Inpatient Hospital Stay: Admission: RE | Admit: 2020-05-16 | Payer: Medicaid Other | Source: Ambulatory Visit

## 2020-05-20 ENCOUNTER — Other Ambulatory Visit: Payer: Self-pay

## 2020-05-20 ENCOUNTER — Ambulatory Visit
Admission: RE | Admit: 2020-05-20 | Discharge: 2020-05-20 | Disposition: A | Discharge status: Home / Self Care | Payer: Medicaid Other | Source: Ambulatory Visit | Attending: General Surgery | Admitting: General Surgery

## 2020-05-20 DIAGNOSIS — D0512 Intraductal carcinoma in situ of left breast: Secondary | ICD-10-CM | POA: Insufficient documentation

## 2020-09-18 ENCOUNTER — Other Ambulatory Visit: Payer: Self-pay | Admitting: Obstetrics and Gynecology

## 2020-09-18 DIAGNOSIS — D0512 Intraductal carcinoma in situ of left breast: Secondary | ICD-10-CM

## 2020-10-11 ENCOUNTER — Other Ambulatory Visit: Payer: Self-pay | Admitting: Obstetrics and Gynecology

## 2020-10-11 DIAGNOSIS — Z1231 Encounter for screening mammogram for malignant neoplasm of breast: Secondary | ICD-10-CM

## 2020-12-06 ENCOUNTER — Other Ambulatory Visit: Payer: Self-pay | Admitting: Family Medicine

## 2020-12-06 DIAGNOSIS — R102 Pelvic and perineal pain: Secondary | ICD-10-CM

## 2021-01-07 ENCOUNTER — Ambulatory Visit
Admission: RE | Admit: 2021-01-07 | Discharge: 2021-01-07 | Disposition: A | Discharge status: Home / Self Care | Payer: Medicaid Other | Attending: Family Medicine | Admitting: Family Medicine

## 2021-01-07 ENCOUNTER — Ambulatory Visit
Admission: RE | Admit: 2021-01-07 | Discharge: 2021-01-07 | Disposition: A | Discharge status: Home / Self Care | Payer: Medicaid Other | Source: Ambulatory Visit | Attending: Family Medicine | Admitting: Family Medicine

## 2021-01-07 ENCOUNTER — Other Ambulatory Visit: Payer: Self-pay

## 2021-01-07 DIAGNOSIS — R102 Pelvic and perineal pain: Secondary | ICD-10-CM | POA: Insufficient documentation

## 2021-03-05 ENCOUNTER — Ambulatory Visit
Admission: RE | Admit: 2021-03-05 | Discharge: 2021-03-05 | Disposition: A | Discharge status: Home / Self Care | Payer: Medicaid Other | Source: Ambulatory Visit | Attending: Obstetrics and Gynecology | Admitting: Obstetrics and Gynecology

## 2021-03-05 ENCOUNTER — Other Ambulatory Visit: Payer: Self-pay

## 2021-03-05 DIAGNOSIS — Z1231 Encounter for screening mammogram for malignant neoplasm of breast: Secondary | ICD-10-CM | POA: Insufficient documentation

## 2021-09-16 ENCOUNTER — Other Ambulatory Visit: Payer: Self-pay | Admitting: Obstetrics and Gynecology

## 2021-09-16 DIAGNOSIS — D0512 Intraductal carcinoma in situ of left breast: Secondary | ICD-10-CM

## 2021-11-08 ENCOUNTER — Encounter: Payer: Self-pay | Admitting: Emergency Medicine

## 2021-11-08 ENCOUNTER — Other Ambulatory Visit: Payer: Self-pay

## 2021-11-08 ENCOUNTER — Ambulatory Visit
Admission: EM | Admit: 2021-11-08 | Discharge: 2021-11-08 | Disposition: A | Discharge status: Home / Self Care | Payer: Medicaid Other | Attending: Emergency Medicine | Admitting: Emergency Medicine

## 2021-11-08 DIAGNOSIS — J069 Acute upper respiratory infection, unspecified: Secondary | ICD-10-CM

## 2021-11-08 MED ORDER — BENZONATATE 100 MG PO CAPS: 200.0000 mg | ORAL_CAPSULE | Freq: Three times a day (TID) | ORAL | 0 refills | Status: DC

## 2021-11-08 MED ORDER — IPRATROPIUM BROMIDE 0.06 % NA SOLN: 2.0000 | Freq: Four times a day (QID) | NASAL | 12 refills | Status: DC

## 2021-11-08 MED ORDER — FLUCONAZOLE 200 MG PO TABS: 200.0000 mg | ORAL_TABLET | Freq: Every day | ORAL | 1 refills | Status: AC

## 2021-11-08 MED ORDER — PROMETHAZINE-DM 6.25-15 MG/5ML PO SYRP: 5.0000 mL | ORAL_SOLUTION | Freq: Four times a day (QID) | ORAL | 0 refills | Status: DC | PRN

## 2021-11-08 MED ORDER — AMOXICILLIN-POT CLAVULANATE 875-125 MG PO TABS: 1.0000 | ORAL_TABLET | Freq: Two times a day (BID) | ORAL | 0 refills | Status: AC

## 2021-11-08 NOTE — ED Provider Notes (Addendum)
[facility]    CSN: [id number] Arrival date & time: 11/08/21  1507      History   Chief Complaint Chief Complaint  Patient presents with   Sinus Problem    HPI Robin Brooks is a 61 y.o. female.   HPI  61 year old female here for evaluation of respiratory complaints.  Patient reports that she has been experiencing nasal congestion, sinus pressure, green nasal discharge, ear pain, and a productive cough of green sputum for the last 3 days.  She denies fever, sore throat, shortness breath or wheezing, or GI complaints.  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, High-grade, comedo necrosis ER/PR positive   Colitis    gastric   Dyspnea    GERD (gastroesophageal reflux disease)    Heart murmur    ASYMPTOMATIC   History of methicillin resistant staphylococcus aureus (MRSA) 2013   IBS (irritable bowel syndrome)    Personal history of radiation therapy 2018   Left breast cancer, mammosite   Pneumonia 2003   H/O    Patient Active Problem List   Diagnosis Date Noted   Left hand pain 11/01/2018   Numbness and tingling in left hand 11/01/2018   Morbid obesity with BMI of 45.0-49.9, adult (HCC) 09/20/2018   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   Surgeon: Earline Mayotte, MD;  Location: [facility];  Service: General;  Laterality: Left;   BREAST MAMMOSITE Left 05/12/2017   Procedure: MAMMOSITE BREAST;  Surgeon: Earline Mayotte, MD;  Location: [facility];  Service: General;   Laterality: Left;   CARPAL TUNNEL RELEASE Left 05/17/2019   Procedure: CARPAL TUNNEL RELEASE ENDOSCOPIC;  Surgeon: Christena Flake, MD;  Location: [facility];  Service: Orthopedics;  Laterality: Left;   CESAREAN SECTION     CHOLECYSTECTOMY     FOOT SURGERY     left   TUBAL LIGATION  1995    OB History     Gravida  1   Para      Term      Preterm      AB      Living  1      SAB      IAB      Ectopic      Multiple      Live Births  1        Obstetric Comments  1st Menstrual Cycle:  12 1st Pregnancy: 30           Home Medications    Prior to Admission medications   Medication Sig Start Date End Date Taking? Authorizing Provider  amoxicillin-clavulanate (AUGMENTIN) 875-125 MG tablet Take 1 tablet by mouth every 12 (twelve) hours for 10 days. 11/08/21 11/18/21 Yes Becky Augusta, NP  benzonatate (TESSALON) 100 MG capsule Take 2 capsules (200 mg total) by mouth every 8 (eight) hours. 11/08/21  Yes Becky Augusta, NP  cetirizine (ZYRTEC) 10 MG tablet Take 10  mg by mouth daily.   Yes [provider]  fluconazole (DIFLUCAN) 200 MG tablet Take 1 tablet (200 mg total) by mouth daily for 2 doses. 11/08/21 11/10/21 Yes Becky Augusta, NP  FLUoxetine (PROZAC) 20 MG capsule Take 20 mg by mouth daily before breakfast.   Yes [provider]  gabapentin (NEURONTIN) 100 MG capsule Take by mouth.   Yes [provider]  ipratropium (ATROVENT) 0.06 % nasal spray Place 2 sprays into both nostrils 4 (four) times daily. 11/08/21  Yes Becky Augusta, NP  omeprazole (PRILOSEC) 20 MG capsule Take 20 mg by mouth daily before breakfast.    Yes [provider]  promethazine-dextromethorphan (PROMETHAZINE-DM) 6.25-15 MG/5ML syrup Take 5 mLs by mouth 4 (four) times daily as needed. 11/08/21  Yes Becky Augusta, NP  raloxifene (EVISTA) 60 MG tablet Take 1 tablet (60 mg total) by mouth daily. 07/29/17  Yes Byrnett, Merrily Pew, MD  Vitamin Mixture (ESTER-C PO) Take 500 mg by  mouth daily.    [provider]    Family History Family History  Problem Relation Age of Onset   Other Father    Breast cancer Paternal Aunt     Social History Social History   Tobacco Use   Smoking status: Former    Packs/day: 0.25    Years: 35.00    Pack years: 8.75    Types: Cigarettes    Quit date: 05/18/2017    Years since quitting: 4.4   Smokeless tobacco: Never  Vaping Use   Vaping Use: Never used  Substance Use Topics   Alcohol use: No   Drug use: No     Allergies   Patient has no known allergies.   Review of Systems Review of Systems  Constitutional:  Negative for fever.  HENT:  Positive for congestion, ear pain and rhinorrhea. Negative for sore throat.   Respiratory:  Positive for cough. Negative for shortness of breath and wheezing.   Gastrointestinal:  Negative for diarrhea, nausea and vomiting.  Skin:  Negative for rash.  Hematological: Negative.   Psychiatric/Behavioral: Negative.      Physical Exam Triage Vital Signs ED Triage Vitals  Enc Vitals Group     BP 11/08/21 1526 (!) 144/62     Pulse Rate 11/08/21 1526 70     Resp 11/08/21 1526 14     Temp 11/08/21 1526 98.5 F (36.9 C)     Temp Source 11/08/21 1526 Oral     SpO2 11/08/21 1526 98 %     Weight 11/08/21 1524 259 lb 14.8 oz (117.9 kg)     Height 11/08/21 1524 5\' 2"  (1.575 m)     Head Circumference --      Peak Flow --      Pain Score 11/08/21 1524 7     Pain Loc --      Pain Edu? --      Excl. in [place]? --    No data found.  Updated Vital Signs BP (!) 144/62 (BP Location: Left Arm)   Pulse 70   Temp 98.5 F (36.9 C) (Oral)   Resp 14   Ht 5\' 2"  (1.575 m)   Wt 259 lb 14.8 oz (117.9 kg)   LMP 06/20/2015 (Approximate)   SpO2 98%   BMI 47.54 kg/m   Visual Acuity Right Eye Distance:   Left Eye Distance:   Bilateral Distance:    Right Eye Near:   Left Eye Near:    Bilateral Near:  Physical Exam Vitals and nursing note reviewed.  Constitutional:       Appearance: Normal appearance. She is not ill-appearing.  HENT:     Head: Normocephalic and atraumatic.     Right Ear: Tympanic membrane, ear canal and external ear normal. There is no impacted cerumen.     Left Ear: Tympanic membrane, ear canal and external ear normal. There is no impacted cerumen.     Nose: Congestion and rhinorrhea present.     Mouth/Throat:     Mouth: Mucous membranes are moist.     Pharynx: Oropharynx is clear. No posterior oropharyngeal erythema.  Cardiovascular:     Rate and Rhythm: Normal rate and regular rhythm.     Pulses: Normal pulses.     Heart sounds: Normal heart sounds. No murmur heard.   No friction rub. No gallop.  Pulmonary:     Effort: Pulmonary effort is normal.     Breath sounds: Normal breath sounds. No wheezing, rhonchi or rales.  Musculoskeletal:     Cervical back: Normal range of motion and neck supple.  Lymphadenopathy:     Cervical: No cervical adenopathy.  Skin:    General: Skin is warm and dry.     Capillary Refill: Capillary refill takes less than 2 seconds.     Findings: No erythema or rash.  Neurological:     General: No focal deficit present.     Mental Status: She is alert and oriented to person, place, and time.  Psychiatric:        Mood and Affect: Mood normal.        Behavior: Behavior normal.        Thought Content: Thought content normal.        Judgment: Judgment normal.     UC Treatments / Results  Labs (all labs ordered are listed, but only abnormal results are displayed) Labs Reviewed - No data to display  EKG   Radiology No results found.  Procedures Procedures (including critical care time)  Medications Ordered in UC Medications - No data to display  Initial Impression / Assessment and Plan / UC Course  I have reviewed the triage vital signs and the nursing notes.  Pertinent labs & imaging results that were available during my care of the patient were reviewed by me and considered in my medical  decision making (see chart for details).  Patient is a nontoxic-appearing 61 year old female here for evaluation of sinus complaints as outlined HPI above.  On physical exam patient has pearly-gray tympanic membranes bilaterally with normal light reflex and clear external auditory canals.  Nasal mucosa is erythematous edematous with purulent discharge in both nares.  Patient does have tenderness to frontal and maxillary sinuses to percussion.  Oropharyngeal exam is benign.  No cervical lymphadenopathy appreciable exam.  Cardiopulmonary exam feels clung sounds in all fields.  Patient exam is consistent with an upper respiratory infection.  We will treat patient with Augmentin twice daily for 10 days, Atrovent nasal spray, Tessalon Perles, Promethazine DM cough syrup.  Patient is also requesting Diflucan for treatment of yeast infection should she develop 1 while on antibiotic therapy.  We will send prescription to pharmacy.   Final Clinical Impressions(s) / UC Diagnoses   Final diagnoses:  None     Discharge Instructions      The Augmentin twice daily with food for 10 days for treatment of your sinusitis.  Perform sinus irrigation 2-3 times a day with a NeilMed sinus rinse kit and distilled  water.  Do not use tap water.  You can use plain over-the-counter Mucinex every 6 hours to break up the stickiness of the mucus so your body can clear it.  Increase your oral fluid intake to thin out your mucus so that is also able for your body to clear more easily.  Take an over-the-counter probiotic, such as Culturelle-align-activia, 1 hour after each dose of antibiotic to prevent diarrhea.  Use the Atrovent nasal spray, 2 squirts in each nostril every 6 hours, as needed for runny nose and postnasal drip.  Use the Tessalon Perles every 8 hours during the day.  Take them with a small sip of water.  They may give you some numbness to the base of your tongue or a metallic taste in your mouth, this is  normal.  Use the Promethazine DM cough syrup at bedtime for cough and congestion.  It will make you drowsy so do not take it during the day.  If you develop symptoms of a yeast infection take 1 diflucan tablet at onset and repeat in 1 week as needed.  If you develop any new or worsening symptoms return for reevaluation or see your primary care provider.      ED Prescriptions     Medication Sig Dispense Auth. Provider   amoxicillin-clavulanate (AUGMENTIN) 875-125 MG tablet Take 1 tablet by mouth every 12 (twelve) hours for 10 days. 20 tablet Becky Augusta, NP   ipratropium (ATROVENT) 0.06 % nasal spray Place 2 sprays into both nostrils 4 (four) times daily. 15 mL Becky Augusta, NP   benzonatate (TESSALON) 100 MG capsule Take 2 capsules (200 mg total) by mouth every 8 (eight) hours. 21 capsule Becky Augusta, NP   promethazine-dextromethorphan (PROMETHAZINE-DM) 6.25-15 MG/5ML syrup Take 5 mLs by mouth 4 (four) times daily as needed. 118 mL Becky Augusta, NP   fluconazole (DIFLUCAN) 200 MG tablet Take 1 tablet (200 mg total) by mouth daily for 2 doses. 2 tablet Becky Augusta, NP      PDMP not reviewed this encounter.   Becky Augusta, NP 11/08/21 1604    Becky Augusta, NP 11/08/21 1605

## 2021-11-08 NOTE — ED Triage Notes (Signed)
Patient c/o sinus pain and pressure, nasal congestion and cough that started 3 days ago.  Patient denies fevers.  ?

## 2021-11-08 NOTE — Discharge Instructions (Addendum)
The Augmentin twice daily with food for 10 days for treatment of your sinusitis. ? ?Perform sinus irrigation 2-3 times a day with a NeilMed sinus rinse kit and distilled water.  Do not use tap water. ? ?You can use plain over-the-counter Mucinex every 6 hours to break up the stickiness of the mucus so your body can clear it. ? ?Increase your oral fluid intake to thin out your mucus so that is also able for your body to clear more easily. ? ?Take an over-the-counter probiotic, such as Culturelle-align-activia, 1 hour after each dose of antibiotic to prevent diarrhea. ? ?Use the Atrovent nasal spray, 2 squirts in each nostril every 6 hours, as needed for runny nose and postnasal drip. ? ?Use the Tessalon Perles every 8 hours during the day.  Take them with a small sip of water.  They may give you some numbness to the base of your tongue or a metallic taste in your mouth, this is normal. ? ?Use the Promethazine DM cough syrup at bedtime for cough and congestion.  It will make you drowsy so do not take it during the day. ? ?If you develop symptoms of a yeast infection take 1 diflucan tablet at onset and repeat in 1 week as needed. ? ?If you develop any new or worsening symptoms return for reevaluation or see your primary care provider.  ?

## 2021-11-18 ENCOUNTER — Telehealth (INDEPENDENT_AMBULATORY_CARE_PROVIDER_SITE_OTHER): Payer: Self-pay

## 2021-11-18 NOTE — Telephone Encounter (Signed)
LVM for pt Robin Brooks in regards to the VM she left yesterday. ?

## 2021-11-28 ENCOUNTER — Encounter (INDEPENDENT_AMBULATORY_CARE_PROVIDER_SITE_OTHER): Payer: Self-pay | Admitting: Nurse Practitioner

## 2021-12-12 ENCOUNTER — Ambulatory Visit
Admission: EM | Admit: 2021-12-12 | Discharge: 2021-12-12 | Disposition: A | Discharge status: Home / Self Care | Payer: Medicaid Other

## 2021-12-12 ENCOUNTER — Ambulatory Visit (INDEPENDENT_AMBULATORY_CARE_PROVIDER_SITE_OTHER): Payer: Medicaid Other

## 2021-12-12 DIAGNOSIS — M25562 Pain in left knee: Secondary | ICD-10-CM

## 2021-12-12 DIAGNOSIS — M1712 Unilateral primary osteoarthritis, left knee: Secondary | ICD-10-CM | POA: Diagnosis not present

## 2021-12-12 MED ORDER — METHYLPREDNISOLONE 4 MG PO TBPK: ORAL_TABLET | ORAL | 0 refills | Status: DC

## 2021-12-12 MED ORDER — KETOROLAC TROMETHAMINE 30 MG/ML IJ SOLN: 30.0000 mg | Freq: Once | INTRAMUSCULAR | Status: DC

## 2021-12-12 MED ORDER — KETOROLAC TROMETHAMINE 30 MG/ML IJ SOLN
30.0000 mg | Freq: Once | INTRAMUSCULAR | Status: AC
  Administered 2021-12-12: 30 mg via INTRAMUSCULAR

## 2021-12-12 NOTE — Discharge Instructions (Addendum)
-  Your x-ray shows that you have some arthritis in your knee.  This could be the cause of your pain or could be related to your sciatic nerve condition. ?- We have given you an injection in the clinic of an anti-inflammatory medication and I have sent a steroid anti-inflammatory medication to pharmacy.  You can still take the Tylenol, ice and elevate your knee.  Sleep with pillows under knees at night. ?- You can use over-the-counter Voltaren anti-inflammatory gel. ?- Follow-up with orthopedics for evaluation to see if you would be a candidate for a injection to the joint if you are still having discomfort. ?- You can also reach out to results physiotherapy and [place].  You do not need an appointment to be seen here.  Physical therapy can help to improve pain and range of motion. ? ?You may have a condition requiring you to follow up with Orthopedics so please call one of the following office for appointment:  ? ?[facility] ?[address], [place], [place] [address] ?Phone: [phone] ? ?[facility] ?[address], [place], [place] [address] ?Phone: [phone]  ?

## 2021-12-12 NOTE — ED Triage Notes (Signed)
Pt c/o posterior L knee pain intermittent x 1 year.  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  Pain has been the worst it's been last night, unable to sleep.  Difficulty raising left foot more than 6" off floor. No swelling, warmth.  Extra strength Tylenol did help.   ?

## 2021-12-12 NOTE — ED Provider Notes (Signed)
?[place] ? ? ? ?CSN: [id number] ?Arrival date & time: 12/12/21  1516 ? ? ?  ? ?History   ?Chief Complaint ?Chief Complaint  ?Patient presents with  ? Knee Pain  ?  L  ? ? ?HPI ?Robin Brooks is a 61 y.o. female presenting for left knee pain for the past couple of months.  She says the posterior knee is what is hurting her but she also has pain of the medial knee.  She denies any injury.  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  She reports increased pain when standing for long time and improvement of symptoms when she is able to sit and rest.  She has also taken extra strength Tylenol which is helped a little bit.  Increased pain when she flexes her knee beyond 90 degrees.  Does not really have any pain when she fully extends the knee.  She denies that it feels weak or unstable.  She denies any known problems with his knee.  Does report a history of sciatica for which she takes gabapentin.  She says that causes her to have burning sensation and sometimes itching sensation on the lateral and posterior left thigh.  She has never had it affect her knee before.  Patient has no associated swelling of the knee.  No other complaints. ? ?HPI ? ?Past Medical History:  ?Diagnosis Date  ? Allergy   ? Anemia   ? Anxiety   ? Arthritis   ? Breast cancer ([place]) 2018  ? DCIS, High-grade, comedo necrosis ER/PR positive  ? Colitis   ? gastric  ? Dyspnea   ? GERD (gastroesophageal reflux disease)   ? Heart murmur   ? ASYMPTOMATIC  ? History of methicillin resistant staphylococcus aureus (MRSA) 2013  ? IBS (irritable bowel syndrome)   ? Personal history of radiation therapy 2018  ? Left breast cancer, mammosite  ? Pneumonia 2003  ? H/O  ? ? ?Patient Active Problem List  ? Diagnosis Date Noted  ? Left hand pain 11/01/2018  ? Numbness and tingling in left hand 11/01/2018  ? Morbid obesity with BMI of 45.0-49.9, adult ([place]) 09/20/2018  ? Ductal carcinoma in situ (DCIS) of left  breast 03/11/2017  ? IDA (iron deficiency anemia) 02/27/2014  ? Allergic rhinitis 01/30/2014  ? GERD (gastroesophageal reflux disease) 01/30/2014  ? IBS (irritable bowel syndrome) 01/30/2014  ? ? ?Past Surgical History:  ?Procedure Laterality Date  ? BREAST BIOPSY Left 03/02/2017  ? DCIS  ? BREAST CYST EXCISION Left 80s  ? neg  ? BREAST LUMPECTOMY Left 04/19/2017  ? Wide excision high grade DCIS, ER/PR positive.   Surgeon: Robert Bellow, MD;  Location: [facility];  Service: General;  Laterality: Left;  ? BREAST MAMMOSITE Left 05/12/2017  ? Procedure: MAMMOSITE BREAST;  Surgeon: Robert Bellow, MD;  Location: [facility];  Service: General;  Laterality: Left;  ? CARPAL TUNNEL RELEASE Left 05/17/2019  ? Procedure: CARPAL TUNNEL RELEASE ENDOSCOPIC;  Surgeon: Corky Mull, MD;  Location: [facility];  Service: Orthopedics;  Laterality: Left;  ? CESAREAN SECTION    ? CHOLECYSTECTOMY    ? FOOT SURGERY    ? left  ? TUBAL LIGATION  1995  ? ? ?OB History   ? ? Gravida  ?1  ? Para  ?   ? Term  ?   ? Preterm  ?   ? AB  ?   ?  Living  ?1  ?  ? ? SAB  ?   ? IAB  ?   ? Ectopic  ?   ? Multiple  ?   ? Live Births  ?1  ?   ?  ? Obstetric Comments  ?1st Menstrual Cycle:  12 ?1st Pregnancy: 30 ?  ?  ? ?  ? ? ? ?Home Medications   ? ?Prior to Admission medications   ?Medication Sig Start Date End Date Taking? Authorizing Provider  ?calcium carbonate (OS-CAL - DOSED IN MG OF ELEMENTAL CALCIUM) 1250 (500 Ca) MG tablet Take 1 tablet by mouth.   Yes [provider]  ?methylPREDNISolone (MEDROL DOSEPAK) 4 MG TBPK tablet Take 6 tabs p.o. on day 1 and decrease by 1 tablet daily until complete 12/12/21  Yes Danton Clap, PA-C  ?benzonatate (TESSALON) 100 MG capsule Take 2 capsules (200 mg total) by mouth every 8 (eight) hours. 11/08/21   Margarette Canada, NP  ?cetirizine (ZYRTEC) 10 MG tablet Take 10 mg by mouth daily.    [provider]  ?FLUoxetine (PROZAC) 20 MG capsule Take 20 mg by mouth daily before breakfast.    [provider]  ?gabapentin (NEURONTIN) 100 MG capsule Take by mouth.    [provider]  ?ipratropium (ATROVENT) 0.06 % nasal spray Place 2 sprays into both nostrils 4 (four) times daily. 11/08/21   Margarette Canada, NP  ?omeprazole (PRILOSEC) 20 MG capsule Take 20 mg by mouth daily before breakfast.     [provider]  ?promethazine-dextromethorphan (PROMETHAZINE-DM) 6.25-15 MG/5ML syrup Take 5 mLs by mouth 4 (four) times daily as needed. 11/08/21   Margarette Canada, NP  ?raloxifene (EVISTA) 60 MG tablet Take 1 tablet (60 mg total) by mouth daily. 07/29/17   Robert Bellow, MD  ?Vitamin Mixture (ESTER-C PO) Take 500 mg by mouth daily.    [provider]  ? ? ?Family History ?Family History  ?Problem Relation Age of Onset  ? Other Father   ? Breast cancer Paternal Aunt   ? ? ?Social History ?Social History  ? ?Tobacco Use  ? Smoking status: Former  ?  Packs/day: 0.25  ?  Years: 35.00  ?  Pack years: 8.75  ?  Types: Cigarettes  ?  Quit date: 05/18/2017  ?  Years since quitting: 4.5  ? Smokeless tobacco: Never  ?Vaping Use  ? Vaping Use: Never used  ?Substance Use Topics  ? Alcohol use: No  ? Drug use: No  ? ? ? ?Allergies   ?Patient has no known allergies. ? ? ?Review of Systems ?Review of Systems  ?Musculoskeletal:  Positive for arthralgias. Negative for back pain, gait problem and joint swelling.  ?Skin:  Negative for color change and wound.  ?Neurological:  Negative for weakness and numbness.  ? ? ?Physical Exam ?Triage Vital Signs ?ED Triage Vitals  ?Enc Vitals Group  ?   BP 12/12/21 1543 123/61  ?   Pulse Rate 12/12/21 1543 98  ?   Resp 12/12/21 1543 18  ?   Temp 12/12/21 1543 98.3 ?F (36.8 ?C)  ?   Temp Source 12/12/21 1543 Oral  ?   SpO2 12/12/21 1543 98 %  ?   Weight --   ?   Height --   ?   Head Circumference --   ?   Peak Flow --   ?   Pain Score 12/12/21 1539 8  ?   Pain Loc --   ?  Pain Edu? --   ?   Excl. in [place]? --   ? ?No data found. ? ?Updated Vital Signs ?BP 123/61 (BP Location:  Right Arm)   Pulse 98   Temp 98.3 ?F (36.8 ?C) (Oral)   Resp 18   LMP 06/20/2015 (Approximate)   SpO2 98%  ?  ? ?Physical Exam ?Vitals and nursing note reviewed.  ?Constitutional:   ?   General: She is not in acute distress. ?   Appearance: Normal appearance. She is not ill-appearing or toxic-appearing.  ?HENT:  ?   Head: Normocephalic and atraumatic.  ?Eyes:  ?   General: No scleral icterus.    ?   Right eye: No discharge.     ?   Left eye: No discharge.  ?   Conjunctiva/sclera: Conjunctivae normal.  ?Cardiovascular:  ?   Rate and Rhythm: Normal rate and regular rhythm.  ?   Heart sounds: Normal heart sounds.  ?Pulmonary:  ?   Effort: Pulmonary effort is normal. No respiratory distress.  ?   Breath sounds: Normal breath sounds.  ?Musculoskeletal:  ?   Cervical back: Neck supple.  ?   Left knee: No swelling or effusion. Decreased range of motion (reduced beyond 75 degrees flexion). Tenderness (also TTP anterior and posterior knee) present over the lateral joint line.  ?Skin: ?   General: Skin is dry.  ?Neurological:  ?   General: No focal deficit present.  ?   Mental Status: She is alert. Mental status is at baseline.  ?   Motor: No weakness.  ?   Gait: Gait normal.  ?Psychiatric:     ?   Mood and Affect: Mood normal.     ?   Behavior: Behavior normal.     ?   Thought Content: Thought content normal.  ? ? ? ?UC Treatments / Results  ?Labs ?(all labs ordered are listed, but only abnormal results are displayed) ?Labs Reviewed - No data to display ? ?EKG ? ? ?Radiology ?DG Knee Complete 4 Views Left ? ?Result Date: 12/12/2021 ?CLINICAL DATA:  Intermittent pain for 1 year.  Worse at night. EXAM: LEFT KNEE - COMPLETE 4+ VIEW COMPARISON:  None. FINDINGS: There is diffuse decreased bone mineralization. Mild-to-moderate medial compartment joint space narrowing and mild peripheral degenerative osteophytes. Moderate patellofemoral joint space narrowing with mild peripheral osteophytes. No acute fracture is seen. No  dislocation. 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 Electronically Signed   By: Yvonne Kendall M.D.   On: 12/12/2021 16:09   ? ?Procedures ?Procedures (including critical

## 2021-12-17 ENCOUNTER — Encounter (INDEPENDENT_AMBULATORY_CARE_PROVIDER_SITE_OTHER): Payer: Self-pay | Admitting: Nurse Practitioner

## 2021-12-25 ENCOUNTER — Ambulatory Visit (INDEPENDENT_AMBULATORY_CARE_PROVIDER_SITE_OTHER): Payer: Medicaid Other | Admitting: Nurse Practitioner

## 2021-12-25 ENCOUNTER — Encounter (INDEPENDENT_AMBULATORY_CARE_PROVIDER_SITE_OTHER): Payer: Self-pay | Admitting: Nurse Practitioner

## 2021-12-25 VITALS — BP 116/69 | HR 72 | Resp 17 | Ht 60.0 in | Wt 269.0 lb

## 2021-12-25 DIAGNOSIS — R0989 Other specified symptoms and signs involving the circulatory and respiratory systems: Secondary | ICD-10-CM

## 2021-12-26 ENCOUNTER — Other Ambulatory Visit (INDEPENDENT_AMBULATORY_CARE_PROVIDER_SITE_OTHER): Payer: Self-pay | Admitting: Nurse Practitioner

## 2021-12-26 ENCOUNTER — Other Ambulatory Visit (INDEPENDENT_AMBULATORY_CARE_PROVIDER_SITE_OTHER): Payer: Medicaid Other

## 2021-12-26 DIAGNOSIS — R0989 Other specified symptoms and signs involving the circulatory and respiratory systems: Secondary | ICD-10-CM

## 2022-01-04 ENCOUNTER — Encounter (INDEPENDENT_AMBULATORY_CARE_PROVIDER_SITE_OTHER): Payer: Self-pay | Admitting: Nurse Practitioner

## 2022-01-04 NOTE — Progress Notes (Signed)
? ?Subjective:  ? ? Patient ID: Robin Brooks, female    DOB: 1960/11/10, 61 y.o.   MRN: [id number] ?No chief complaint on file. ? ? ?Robin Brooks is a 61 year old female that presents today as a referral from Dr. Luana Shu in regards to concerning foot pain.  Following evaluation there is consideration for possible surgical intervention to the right foot however there is concern due to some diminished palpable pulses of the possibility of decreased perfusion and altered wound healing.  Currently the patient denies any claudication-like symptoms.  She denies any chest pain or shortness of breath.  She denies any rest pain like symptoms. ? ? ?Review of Systems  ?All other systems reviewed and are negative. ? ?   ?Objective:  ? Physical Exam ?Vitals reviewed.  ?HENT:  ?   Head: Normocephalic.  ?Cardiovascular:  ?   Rate and Rhythm: Normal rate.  ?   Pulses: Normal pulses.  ?Pulmonary:  ?   Effort: Pulmonary effort is normal.  ?Skin: ?   General: Skin is warm and dry.  ?Neurological:  ?   Mental Status: She is alert and oriented to person, place, and time.  ?Psychiatric:     ?   Mood and Affect: Mood normal.     ?   Behavior: Behavior normal.     ?   Thought Content: Thought content normal.     ?   Judgment: Judgment normal.  ? ? ?BP 116/69 (BP Location: Right Arm)   Pulse 72   Resp 17   Ht 5' (1.524 m)   Wt 269 lb (122 kg)   LMP 06/20/2015 (Approximate)   BMI 52.54 kg/m?  ? ?Past Medical History:  ?Diagnosis Date  ? Allergy   ? Anemia   ? Anxiety   ? Arthritis   ? 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 Colitis   ? gastric  ? Dyspnea   ? GERD (gastroesophageal reflux disease)   ? Heart murmur   ? ASYMPTOMATIC  ? History of methicillin resistant staphylococcus aureus (MRSA) 2013  ? IBS (irritable bowel syndrome)   ? Personal history of radiation therapy 2018  ? Left breast cancer, mammosite  ? Pneumonia 2003  ? H/O  ? ? ?Social History  ? ?Socioeconomic History  ? Marital  status: Married  ?  Spouse name: Not on file  ? Number of children: Not on file  ? Years of education: Not on file  ? Highest education level: Not on file  ?Occupational History  ? Not on file  ?Tobacco Use  ? Smoking status: Former  ?  Packs/day: 0.25  ?  Years: 35.00  ?  Pack years: 8.75  ?  Types: Cigarettes  ?  Quit date: 05/18/2017  ?  Years since quitting: 4.6  ? Smokeless tobacco: Never  ?Vaping Use  ? Vaping Use: Never used  ?Substance and Sexual Activity  ? Alcohol use: No  ? Drug use: No  ? Sexual activity: Not on file  ?Other Topics Concern  ? Not on file  ?Social History Narrative  ? Not on file  ? ?Social Determinants of Health  ? ?Financial Resource Strain: Not on file  ?Food Insecurity: Not on file  ?Transportation Needs: Not on file  ?Physical Activity: Not on file  ?Stress: Not on file  ?Social Connections: Not on file  ?Intimate Partner Violence: Not on file  ? ? ?Past Surgical History:  ?Procedure Laterality Date  ?  BREAST BIOPSY Left 03/02/2017  ? DCIS  ? BREAST CYST EXCISION Left 80s  ? neg  ? BREAST LUMPECTOMY Left 04/19/2017  ? Wide excision high grade DCIS, ER/PR positive.   Surgeon: Robert Bellow, MD;  Location: [facility];  Service: General;  Laterality: Left;  ? BREAST MAMMOSITE Left 05/12/2017  ? Procedure: MAMMOSITE BREAST;  Surgeon: Robert Bellow, MD;  Location: [facility];  Service: General;  Laterality: Left;  ? CARPAL TUNNEL RELEASE Left 05/17/2019  ? Procedure: CARPAL TUNNEL RELEASE ENDOSCOPIC;  Surgeon: Corky Mull, MD;  Location: [facility];  Service: Orthopedics;  Laterality: Left;  ? CESAREAN SECTION    ? CHOLECYSTECTOMY    ? FOOT SURGERY    ? left  ? TUBAL LIGATION  1995  ? ? ?Family History  ?Problem Relation Age of Onset  ? Other Father   ? Breast cancer Paternal Aunt   ? ? ?Allergies  ?Allergen Reactions  ? Coriandrum Sativum   ?  Numbs tongue  ? ? ? ?  Latest Ref Rng & Units 05/12/2019  ?  8:52 AM 04/14/2017  ? 10:48 AM  ?CBC  ?WBC 4.0 - 10.5 K/uL 5.4     ?Hemoglobin 12.0 -  15.0 g/dL 12.0   13.1    ?Hematocrit 36.0 - 46.0 % 38.0     ?Platelets 150 - 400 K/uL 253     ? ? ? ? ?CMP  ?   ?Component Value Date/Time  ? NA 135 11/10/2019 1116  ? K 3.5 11/10/2019 1116  ? CL 102 11/10/2019 1116  ? CO2 28 11/10/2019 1116  ? GLUCOSE 126 (H) 11/10/2019 1116  ? BUN 10 11/10/2019 1116  ? CREATININE 0.65 11/10/2019 1116  ? CALCIUM 8.8 (L) 11/10/2019 1116  ? GFRNONAA >60 11/10/2019 1116  ? GFRAA >60 11/10/2019 1116  ? ? ? ?No results found. ? ?   ?Assessment & Plan:  ? ?1. Diminished pulses in lower extremity ?Today the patient has palpable pulses bilaterally however there was previous concern by the patient's podiatrist that there may not be adequate perfusion for possible wound healing due to upcoming surgery.  The patient ABIs of 1.01 on the right and 0.98 on the left.  TBI's are normal with triphasic tibial artery waveforms and normal toe waveforms bilaterally.  Based on the studies the patient should be able to adequately heal wounds post surgery. ? ? ?Current Outpatient Medications on File Prior to Visit  ?Medication Sig Dispense Refill  ? calcium carbonate (OS-CAL - DOSED IN MG OF ELEMENTAL CALCIUM) 1250 (500 Ca) MG tablet Take 1 tablet by mouth.    ? cetirizine (ZYRTEC) 10 MG tablet Take 10 mg by mouth daily.    ? FLUoxetine (PROZAC) 20 MG capsule Take 20 mg by mouth daily before breakfast.    ? gabapentin (NEURONTIN) 100 MG capsule Take by mouth.    ? ketorolac (TORADOL) 10 MG tablet Take 10 mg by mouth every 6 (six) hours as needed.    ? loperamide (IMODIUM A-D) 2 MG tablet Take by mouth.    ? omeprazole (PRILOSEC) 20 MG capsule Take 20 mg by mouth daily before breakfast.     ? raloxifene (EVISTA) 60 MG tablet Take 1 tablet (60 mg total) by mouth daily. 90 tablet 3  ? benzonatate (TESSALON) 100 MG capsule Take 2 capsules (200 mg total) by mouth every 8 (eight) hours. (Patient not taking: Reported on 12/25/2021) 21 capsule 0  ? ipratropium (ATROVENT) 0.06 % nasal spray  Place 2 sprays into both  nostrils 4 (four) times daily. (Patient not taking: Reported on 12/25/2021) 15 mL 12  ? Vitamin Mixture (ESTER-C PO) Take 500 mg by mouth daily. (Patient not taking: Reported on 12/25/2021)    ? ?No current facility-administered medications on file prior to visit.  ? ? ?There are no Patient Instructions on file for this visit. ?No follow-ups on file. ? ? ?Kris Hartmann, NP ? ? ?

## 2022-02-05 ENCOUNTER — Other Ambulatory Visit: Payer: Self-pay | Admitting: Family Medicine

## 2022-02-05 DIAGNOSIS — Z1231 Encounter for screening mammogram for malignant neoplasm of breast: Secondary | ICD-10-CM

## 2022-02-09 ENCOUNTER — Other Ambulatory Visit: Payer: Self-pay | Admitting: General Surgery

## 2022-02-09 DIAGNOSIS — D0512 Intraductal carcinoma in situ of left breast: Secondary | ICD-10-CM

## 2022-03-10 ENCOUNTER — Other Ambulatory Visit: Payer: Self-pay

## 2022-03-10 ENCOUNTER — Encounter
Admission: RE | Admit: 2022-03-10 | Discharge: 2022-03-10 | Disposition: A | Discharge status: Home / Self Care | Payer: Medicaid Other | Source: Ambulatory Visit | Attending: Podiatry | Admitting: Podiatry

## 2022-03-10 NOTE — Patient Instructions (Addendum)
Your procedure is scheduled on: 03/20/22 - Friday Report to the Registration Desk on the 1st floor of the [facility]. To find out your arrival time, please call [phone] between 1PM - 3PM on: 03/19/22 - Thursday If your arrival time is 6:00 am, do not arrive prior to that time as the [facility] entrance doors do not open until 6:00 am.  REMEMBER: Instructions that are not followed completely may result in serious medical risk, up to and including death; or upon the discretion of your surgeon and anesthesiologist your surgery may need to be rescheduled.  Do not eat food after midnight the night before surgery.  No gum chewing, lozengers or hard candies.  You may however, drink CLEAR liquids up to 2 hours before you are scheduled to arrive for your surgery. Do not drink anything within 2 hours of your scheduled arrival time.  Clear liquids include: - water  - apple juice without pulp - gatorade (not RED colors) - black coffee or tea (Do NOT add milk or creamers to the coffee or tea) Do NOT drink anything that is not on this list.  TAKE THESE MEDICATIONS THE MORNING OF SURGERY WITH A SIP OF WATER:  - FLUoxetine (PROZAC) 20 MG capsule - gabapentin (NEURONTIN) 600 MG capsule - omeprazole (PRILOSEC) 20 MG capsule, (take one the night before and one on the morning of surgery - helps to prevent nausea after surgery.) - raloxifene (EVISTA) 60 MG tablet  One week prior to surgery: 03/13/22:  Stop Anti-inflammatories (NSAIDS) such as Advil, Aleve, Ibuprofen, Motrin, Naproxen, Naprosyn and Aspirin based products such as Excedrin, Goodys Powder, BC Powder.  Stop on 03/13/22 , ANY OVER THE COUNTER supplements until after surgery.  You may take Tylenol if needed for pain up until the day of surgery.  No Alcohol for 24 hours before or after surgery.  No Smoking including e-cigarettes for 24 hours prior to surgery.  No chewable tobacco products for at least 6 hours prior to surgery.   No nicotine patches on the day of surgery.  Do not use any "recreational" drugs for at least a week prior to your surgery.  Please be advised that the combination of cocaine and anesthesia may have negative outcomes, up to and including death. If you test positive for cocaine, your surgery will be cancelled.  On the morning of surgery brush your teeth with toothpaste and water, you may rinse your mouth with mouthwash if you wish. Do not swallow any toothpaste or mouthwash.  Do not wear jewelry, make-up, hairpins, clips or nail polish.  Do not wear lotions, powders, or perfumes.   Do not shave body from the neck down 48 hours prior to surgery just in case you cut yourself which could leave a site for infection.  Also, freshly shaved skin may become irritated if using the CHG soap.  Contact lenses, hearing aids and dentures may not be worn into surgery.  Do not bring valuables to the hospital. [facility] is not responsible for any missing/lost belongings or valuables.   Notify your doctor if there is any change in your medical condition (cold, fever, infection).  Wear comfortable clothing (specific to your surgery type) to the hospital.  After surgery, you can help prevent lung complications by doing breathing exercises.  Take deep breaths and cough every 1-2 hours. Your doctor may order a device called an Incentive Spirometer to help you take deep breaths. When coughing or sneezing, hold a pillow firmly against your incision  with both hands. This is called "splinting." Doing this helps protect your incision. It also decreases belly discomfort.  If you are being admitted to the hospital overnight, leave your suitcase in the car. After surgery it may be brought to your room.  If you are being discharged the day of surgery, you will not be allowed to drive home. You will need a responsible adult (18 years or older) to drive you home and stay with you that night.   If you are taking  public transportation, you will need to have a responsible adult (18 years or older) with you. Please confirm with your physician that it is acceptable to use public transportation.   Please call the [facility] Dept. at [phone] if you have any questions about these instructions.  Surgery Visitation Policy:  Patients undergoing a surgery or procedure may have two family members or support persons with them as long as the person is not COVID-19 positive or experiencing its symptoms.   Inpatient Visitation:    Visiting hours are 7 a.m. to 8 p.m. Up to four visitors are allowed at one time in a patient room, including children. The visitors may rotate out with other people during the day. One designated support person (adult) may remain overnight.

## 2022-03-18 ENCOUNTER — Ambulatory Visit
Admission: RE | Admit: 2022-03-18 | Discharge: 2022-03-18 | Disposition: A | Discharge status: Home / Self Care | Payer: Medicaid Other | Source: Ambulatory Visit | Attending: Family Medicine | Admitting: Family Medicine

## 2022-03-18 DIAGNOSIS — Z1231 Encounter for screening mammogram for malignant neoplasm of breast: Secondary | ICD-10-CM | POA: Diagnosis present

## 2022-03-19 SURGERY — Surgical Case
Anesthesia: *Unknown

## 2022-03-20 ENCOUNTER — Ambulatory Visit: Admission: RE | Admit: 2022-03-20 | Payer: Medicaid Other | Source: Ambulatory Visit | Admitting: Podiatry

## 2022-03-20 ENCOUNTER — Encounter: Admission: RE | Payer: Self-pay | Source: Ambulatory Visit

## 2022-03-20 SURGERY — BUNIONECTOMY, AKIN
Anesthesia: Choice | Site: Toe | Laterality: Right

## 2022-05-11 ENCOUNTER — Other Ambulatory Visit: Payer: Self-pay | Admitting: General Surgery

## 2022-05-11 DIAGNOSIS — D0512 Intraductal carcinoma in situ of left breast: Secondary | ICD-10-CM

## 2022-06-01 ENCOUNTER — Ambulatory Visit: Admission: RE | Admit: 2022-06-01 | Payer: Medicaid Other | Source: Ambulatory Visit

## 2022-06-01 ENCOUNTER — Encounter: Admission: RE | Payer: Self-pay | Source: Ambulatory Visit

## 2022-06-01 SURGERY — COLONOSCOPY WITH PROPOFOL
Anesthesia: General

## 2022-06-03 ENCOUNTER — Inpatient Hospital Stay: Admission: RE | Admit: 2022-06-03 | Payer: Medicaid Other | Source: Ambulatory Visit

## 2022-06-29 ENCOUNTER — Encounter (INDEPENDENT_AMBULATORY_CARE_PROVIDER_SITE_OTHER): Payer: Self-pay

## 2022-07-30 ENCOUNTER — Ambulatory Visit
Admission: EM | Admit: 2022-07-30 | Discharge: 2022-07-30 | Disposition: A | Discharge status: Home / Self Care | Payer: Medicaid Other | Attending: Physician Assistant | Admitting: Physician Assistant

## 2022-07-30 ENCOUNTER — Ambulatory Visit (INDEPENDENT_AMBULATORY_CARE_PROVIDER_SITE_OTHER): Payer: Medicaid Other

## 2022-07-30 ENCOUNTER — Encounter: Payer: Self-pay | Admitting: Emergency Medicine

## 2022-07-30 DIAGNOSIS — R058 Other specified cough: Secondary | ICD-10-CM | POA: Diagnosis present

## 2022-07-30 DIAGNOSIS — J40 Bronchitis, not specified as acute or chronic: Secondary | ICD-10-CM | POA: Insufficient documentation

## 2022-07-30 DIAGNOSIS — Z1152 Encounter for screening for COVID-19: Secondary | ICD-10-CM | POA: Insufficient documentation

## 2022-07-30 DIAGNOSIS — Z853 Personal history of malignant neoplasm of breast: Secondary | ICD-10-CM | POA: Insufficient documentation

## 2022-07-30 DIAGNOSIS — R0989 Other specified symptoms and signs involving the circulatory and respiratory systems: Secondary | ICD-10-CM | POA: Diagnosis not present

## 2022-07-30 DIAGNOSIS — Z923 Personal history of irradiation: Secondary | ICD-10-CM | POA: Diagnosis not present

## 2022-07-30 DIAGNOSIS — R059 Cough, unspecified: Secondary | ICD-10-CM | POA: Diagnosis not present

## 2022-07-30 DIAGNOSIS — R062 Wheezing: Secondary | ICD-10-CM | POA: Diagnosis not present

## 2022-07-30 LAB — RESP PANEL BY RT-PCR (FLU A&B, COVID) ARPGX2
Influenza A by PCR: NEGATIVE
Influenza B by PCR: NEGATIVE
SARS Coronavirus 2 by RT PCR: NEGATIVE

## 2022-07-30 MED ORDER — AZITHROMYCIN 250 MG PO TABS: ORAL_TABLET | ORAL | 0 refills | Status: DC

## 2022-07-30 MED ORDER — BENZONATATE 100 MG PO CAPS: 100.0000 mg | ORAL_CAPSULE | Freq: Three times a day (TID) | ORAL | 0 refills | Status: DC

## 2022-07-30 MED ORDER — ALBUTEROL SULFATE HFA 108 (90 BASE) MCG/ACT IN AERS: 1.0000 | INHALATION_SPRAY | Freq: Four times a day (QID) | RESPIRATORY_TRACT | 0 refills | Status: DC | PRN

## 2022-07-30 NOTE — Discharge Instructions (Addendum)
-  Chest x-ray with appearance of bronchitis but no overt pneumonia -Viral swab was negative for COVID, flu, and RSV -Tessalon: 1 tablet every 8 hours as needed for cough -Albuterol: 1-2 puffs every 6 hours as needed for persistent cough or coughing fits -Azithromycin: Take 2 tablets by mouth the first day followed by one tablet daily for next 4 days.

## 2022-07-30 NOTE — ED Triage Notes (Signed)
Pt c/o cough, chest congestion, wheezing, headache. Started about 3 days ago. Denies fever.  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

## 2022-07-30 NOTE — ED Provider Notes (Signed)
[facility]    CSN: [id number] Arrival date & time: 07/30/22  1017      History   Chief Complaint Chief Complaint  Patient presents with   Cough    HPI SHIRONDA KAIN is a 61 y.o. female.   Patient is a 61 year old female who presents with chief complaint of chest congestion, productive cough cough, and headache is been off-and-on for 3 days.  Patient states she works at [employer] and gets a lot of exposures despite trying to keep as clean as possible.  States chest congestion started Tuesday and states she was outside at least an hour yesterday waiting for her daughter's place to start.  Patient denies any sore throat but reports her cough is productive of yellow mucus.  Patient denies any fever body aches but states she is prone to pneumonia this time a year.  She reports her granddaughter has been sick since school started off-and-on and states that she typically spends at least 1 day a week with her as well as spending the night.  Patient also reports a history of breast cancer that was treated with radiation therapy.    Past Medical History:  Diagnosis Date   Allergy    Anemia    Anxiety    Arthritis    Breast cancer ([place]) 2018   DCIS, High-grade, comedo necrosis ER/PR positive   Colitis    gastric   Dyspnea    GERD (gastroesophageal reflux disease)    Heart murmur    ASYMPTOMATIC   History of methicillin resistant staphylococcus aureus (MRSA) 2013   IBS (irritable bowel syndrome)    Personal history of radiation therapy 2018   Left breast cancer, mammosite   Pneumonia 2003   H/O    Patient Active Problem List   Diagnosis Date Noted   Left hand pain 11/01/2018   Numbness and tingling in left hand 11/01/2018   Morbid obesity with BMI of 45.0-49.9, adult ([place]) 09/20/2018   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   Surgeon: Robert Bellow, MD;  Location: [facility];  Service: General;  Laterality: Left;   BREAST MAMMOSITE Left 05/12/2017   Procedure: MAMMOSITE BREAST;  Surgeon: Robert Bellow, MD;  Location: [facility];  Service: General;  Laterality: Left;   CARPAL TUNNEL RELEASE Left 05/17/2019   Procedure: CARPAL TUNNEL RELEASE ENDOSCOPIC;  Surgeon: Corky Mull, MD;  Location: [facility];  Service: Orthopedics;  Laterality: Left;   CESAREAN SECTION     CHOLECYSTECTOMY     COLONOSCOPY W/ POLYPECTOMY     COLONOSCOPY WITH ESOPHAGOGASTRODUODENOSCOPY (EGD)     FOOT SURGERY     left   TUBAL LIGATION  1995    OB History     Gravida  1   Para      Term      Preterm      AB      Living  1      SAB      IAB      Ectopic      Multiple  Live Births  1        Obstetric Comments  1st Menstrual Cycle:  12 1st Pregnancy: 30           Home Medications    Prior to Admission medications   Medication Sig Start Date End Date Taking? Authorizing Provider  albuterol (VENTOLIN HFA) 108 (90 Base) MCG/ACT inhaler Inhale 1-2 puffs into the lungs every 6 (six) hours as needed for wheezing or shortness of breath. 07/30/22  Yes Luvenia Redden, PA-C  azithromycin (ZITHROMAX Z-PAK) 250 MG tablet Take 2 tablets by mouth the first day followed by one tablet daily for next 4 days. 07/30/22  Yes Luvenia Redden, PA-C  benzonatate (TESSALON) 100 MG capsule Take 1 capsule (100 mg total) by mouth every 8 (eight) hours. 07/30/22  Yes Luvenia Redden, PA-C  cetirizine (ZYRTEC) 10 MG tablet Take 10 mg by mouth as needed for allergies.   Yes [provider]  FLUoxetine (PROZAC) 20 MG capsule Take 20 mg by  mouth daily before breakfast.   Yes [provider]  gabapentin (NEURONTIN) 100 MG capsule Take 600 mg by mouth 2 (two) times daily.   Yes [provider]  omeprazole (PRILOSEC) 20 MG capsule Take 20 mg by mouth daily before breakfast.    Yes [provider]  raloxifene (EVISTA) 60 MG tablet Take 1 tablet (60 mg total) by mouth daily. 07/29/17  Yes Byrnett, Forest Gleason, MD  calcium carbonate (OS-CAL - DOSED IN MG OF ELEMENTAL CALCIUM) 1250 (500 Ca) MG tablet Take 1 tablet by mouth.    [provider]  ibuprofen (ADVIL) 200 MG tablet Take 200 mg by mouth in the morning and at bedtime.    [provider]  ketorolac (TORADOL) 10 MG tablet Take 10 mg by mouth every 6 (six) hours as needed. Patient not taking: Reported on 03/10/2022 12/15/21   [provider]  loperamide (IMODIUM A-D) 2 MG tablet Take by mouth.    [provider]  Vitamin Mixture (ESTER-C PO) Take 500 mg by mouth daily. Patient not taking: Reported on 12/25/2021    [provider]    Family History Family History  Problem Relation Age of Onset   Other Father    Breast cancer Paternal Aunt     Social History Social History   Tobacco Use   Smoking status: Former    Packs/day: 0.25    Years: 35.00    Total pack years: 8.75    Types: Cigarettes    Quit date: 05/18/2017    Years since quitting: 5.2   Smokeless tobacco: Never  Vaping Use   Vaping Use: Never used  Substance Use Topics   Alcohol use: No   Drug use: No     Allergies   Coriandrum sativum   Review of Systems Review of Systems as noted above in HPI.  Other systems reviewed and found to be negative   Physical Exam Triage Vital Signs ED Triage Vitals  Enc Vitals Group     BP 07/30/22 1133 (!) 153/81     Pulse Rate 07/30/22 1133 69     Resp 07/30/22 1133 18     Temp 07/30/22 1133 98.7 F (37.1 C)     Temp Source 07/30/22 1133 Oral     SpO2 07/30/22 1133 94 %     Weight 07/30/22  1131 268 lb 15.4 oz (122 kg)     Height 07/30/22 1131 5' (1.524 m)     Head Circumference --  Peak Flow --      Pain Score 07/30/22 1130 0     Pain Loc --      Pain Edu? --      Excl. in [place]? --    No data found.  Updated Vital Signs BP (!) 153/81 (BP Location: Left Arm)   Pulse 69   Temp 98.7 F (37.1 C) (Oral)   Resp 18   Ht 5' (1.524 m)   Wt 268 lb 15.4 oz (122 kg)   LMP 06/20/2015 (Approximate)   SpO2 94%   BMI 52.53 kg/m   Visual Acuity Right Eye Distance:   Left Eye Distance:   Bilateral Distance:    Right Eye Near:   Left Eye Near:    Bilateral Near:     Physical Exam Constitutional:      Appearance: Normal appearance.  HENT:     Mouth/Throat:     Mouth: Mucous membranes are moist.  Cardiovascular:     Rate and Rhythm: Normal rate and regular rhythm.  Pulmonary:     Effort: Pulmonary effort is normal. No respiratory distress.     Breath sounds: Wheezing (low pitch wheeze LUL) and rales (bilateral bases) present.     Comments: Cough Musculoskeletal:     Cervical back: Normal range of motion.  Lymphadenopathy:     Cervical: No cervical adenopathy.  Skin:    General: Skin is warm and dry.  Neurological:     General: No focal deficit present.     Mental Status: She is alert and oriented to person, place, and time.      UC Treatments / Results  Labs (all labs ordered are listed, but only abnormal results are displayed) Labs Reviewed  RESP PANEL BY RT-PCR (FLU A&B, COVID) [id number]    EKG   Radiology DG Chest 2 View  Result Date: 07/30/2022 CLINICAL DATA:  Cough, congestion, wheezing, headache, and coarse breath sounds. History of radiation therapy. EXAM: CHEST - 2 VIEW COMPARISON:  None Available. FINDINGS: The cardiac silhouette is mildly enlarged. The lungs appear hyperinflated on the PA radiograph, and there is peribronchial thickening. No confluent airspace opacity, overt pulmonary edema, pleural effusion, or pneumothorax is identified.  Upper abdominal surgical clips are noted. No acute osseous abnormality is seen. IMPRESSION: Bronchitic changes. Electronically Signed   By: Logan Bores M.D.   On: 07/30/2022 12:05    Procedures Procedures (including critical care time)  Medications Ordered in UC Medications - No data to display  Initial Impression / Assessment and Plan / UC Course  I have reviewed the triage vital signs and the nursing notes.  Pertinent labs & imaging results that were available during my care of the patient were reviewed by me and considered in my medical decision making (see chart for details).    Patient presents with 3-day history of chest congestion, headache, and productive cough.  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  Exam significant for rales to bilateral bases and a low pitched wheeze in the left upper lobe.  Patient also has a history of breast cancer that was treated with radiation therapy.  Check a chest x-ray as well as a viral swab.  Viral swab was negative  Chest x-ray with hyperinflation and peribronchial thickening.  No overt airspace opacities.  Will give her prescription for Tessalon Perles and  albuterol.  Additionally given her history of breast cancer with radiation therapy, we will going give her a course of azithromycin. Final Clinical Impressions(s) / UC Diagnoses   Final diagnoses:  Cough, unspecified type  Bronchitis     Discharge Instructions      -Chest x-ray with appearance of bronchitis but no overt pneumonia -Viral swab was negative for COVID, flu, and RSV -Tessalon: 1 tablet every 8 hours as needed for cough -Albuterol: 1-2 puffs every 6 hours as needed for persistent cough or coughing fits -Azithromycin: Take 2 tablets by mouth the first day followed by one tablet daily for next 4 days.      ED  Prescriptions     Medication Sig Dispense Auth. Provider   azithromycin (ZITHROMAX Z-PAK) 250 MG tablet Take 2 tablets by mouth the first day followed by one tablet daily for next 4 days. 6 tablet Luvenia Redden, PA-C   albuterol (VENTOLIN HFA) 108 (90 Base) MCG/ACT inhaler Inhale 1-2 puffs into the lungs every 6 (six) hours as needed for wheezing or shortness of breath. 18 g Luvenia Redden, PA-C   benzonatate (TESSALON) 100 MG capsule Take 1 capsule (100 mg total) by mouth every 8 (eight) hours. 21 capsule Luvenia Redden, PA-C      PDMP not reviewed this encounter.   Luvenia Redden, PA-C 07/30/22 1234

## 2022-08-22 ENCOUNTER — Encounter: Payer: Self-pay | Admitting: Emergency Medicine

## 2022-08-22 ENCOUNTER — Ambulatory Visit
Admission: EM | Admit: 2022-08-22 | Discharge: 2022-08-22 | Disposition: A | Discharge status: Home / Self Care | Payer: Medicaid Other | Attending: Physician Assistant | Admitting: Physician Assistant

## 2022-08-22 DIAGNOSIS — J180 Bronchopneumonia, unspecified organism: Secondary | ICD-10-CM | POA: Diagnosis not present

## 2022-08-22 DIAGNOSIS — R0602 Shortness of breath: Secondary | ICD-10-CM | POA: Diagnosis not present

## 2022-08-22 DIAGNOSIS — J019 Acute sinusitis, unspecified: Secondary | ICD-10-CM | POA: Diagnosis not present

## 2022-08-22 DIAGNOSIS — R051 Acute cough: Secondary | ICD-10-CM | POA: Diagnosis not present

## 2022-08-22 MED ORDER — PREDNISONE 20 MG PO TABS
40.0000 mg | ORAL_TABLET | Freq: Every day | ORAL | 0 refills | Status: AC
Start: 2022-08-22 — End: 2022-08-27

## 2022-08-22 MED ORDER — CHERATUSSIN AC 100-10 MG/5ML PO SOLN: 10.0000 mL | Freq: Three times a day (TID) | ORAL | 0 refills | Status: DC | PRN

## 2022-08-22 MED ORDER — DOXYCYCLINE HYCLATE 100 MG PO CAPS: 100.0000 mg | ORAL_CAPSULE | Freq: Two times a day (BID) | ORAL | 0 refills | Status: DC

## 2022-08-22 NOTE — ED Provider Notes (Signed)
[facility]    CSN: [id number] Arrival date & time: 08/22/22  0809      History   Chief Complaint Chief Complaint  Patient presents with   Sinus Problem   Headache    HPI Robin Brooks is a 61 y.o. female presenting for approximately a week and a half history of nasal congestion with yellowish-green drainage, sinus pressure, headaches, productive cough and shortness of breath.  Reports getting into coughing fits which caused her to have incontinence episodes.  Reports that she was sick 3 weeks ago and seen here.  Had an x-ray performed which showed bronchitis.  She was treated with azithromycin and benzonatate.  She says she started to feel a little better and felt okay for about a week before symptoms returned.  She has not had any fevers.  She reports some issues with dyspnea in the past which she thinks is related to her weight.  No history of heart disease.  She does have a history of pneumonia in the past.  Taking OTC decongestants without relief.  No other complaints.  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, High-grade, comedo necrosis ER/PR positive   Colitis    gastric   Dyspnea    GERD (gastroesophageal reflux disease)    Heart murmur    ASYMPTOMATIC   History of methicillin resistant staphylococcus aureus (MRSA) 2013   IBS (irritable bowel syndrome)    Personal history of radiation therapy 2018   Left breast cancer, mammosite   Pneumonia 2003   H/O    Patient Active Problem List   Diagnosis Date Noted   Left hand pain 11/01/2018   Numbness and tingling in left hand 11/01/2018   Morbid obesity with BMI of 45.0-49.9, adult ([place]) 09/20/2018   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   Surgeon: Robert Bellow, MD;  Location: [facility];  Service: General;  Laterality: Left;   BREAST MAMMOSITE Left 05/12/2017   Procedure: MAMMOSITE BREAST;  Surgeon: Robert Bellow, MD;  Location: [facility];  Service: General;  Laterality: Left;   CARPAL TUNNEL RELEASE Left 05/17/2019   Procedure: CARPAL TUNNEL RELEASE ENDOSCOPIC;  Surgeon: Corky Mull, MD;  Location: [facility];  Service: Orthopedics;  Laterality: Left;   CESAREAN SECTION     CHOLECYSTECTOMY     COLONOSCOPY W/ POLYPECTOMY     COLONOSCOPY WITH ESOPHAGOGASTRODUODENOSCOPY (EGD)     FOOT SURGERY     left   TUBAL LIGATION  1995    OB History     Gravida  1   Para      Term      Preterm      AB      Living  1      SAB      IAB      Ectopic      Multiple      Live Births  1  Obstetric Comments  1st Menstrual Cycle:  12 1st Pregnancy: 30           Home Medications    Prior to Admission medications   Medication Sig Start Date End Date Taking? Authorizing Provider  doxycycline (VIBRAMYCIN) 100 MG capsule Take 1 capsule (100 mg total) by mouth 2 (two) times daily for 7 days. 08/22/22 08/29/22 Yes Danton Clap, PA-C  guaiFENesin-codeine (CHERATUSSIN AC) 100-10 MG/5ML syrup Take 10 mLs by mouth 3 (three) times daily as needed for cough. 08/22/22  Yes Danton Clap, PA-C  predniSONE (DELTASONE) 20 MG tablet Take 2 tablets (40 mg total) by mouth daily for 5 days. 08/22/22 08/27/22 Yes Danton Clap, PA-C  albuterol (VENTOLIN HFA) 108 (90 Base) MCG/ACT inhaler Inhale 1-2 puffs into the lungs every 6 (six) hours as needed for wheezing or shortness of breath. 07/30/22   Luvenia Redden, PA-C  calcium carbonate (OS-CAL - DOSED IN MG OF ELEMENTAL CALCIUM)  1250 (500 Ca) MG tablet Take 1 tablet by mouth.    [provider]  cetirizine (ZYRTEC) 10 MG tablet Take 10 mg by mouth as needed for allergies.    [provider]  FLUoxetine (PROZAC) 20 MG capsule Take 20 mg by mouth daily before breakfast.    [provider]  gabapentin (NEURONTIN) 100 MG capsule Take 600 mg by mouth 2 (two) times daily.    [provider]  ibuprofen (ADVIL) 200 MG tablet Take 200 mg by mouth in the morning and at bedtime.    [provider]  ketorolac (TORADOL) 10 MG tablet Take 10 mg by mouth every 6 (six) hours as needed. Patient not taking: Reported on 03/10/2022 12/15/21   [provider]  loperamide (IMODIUM A-D) 2 MG tablet Take by mouth.    [provider]  omeprazole (PRILOSEC) 20 MG capsule Take 20 mg by mouth daily before breakfast.     [provider]  raloxifene (EVISTA) 60 MG tablet Take 1 tablet (60 mg total) by mouth daily. 07/29/17   Robert Bellow, MD  Vitamin Mixture (ESTER-C PO) Take 500 mg by mouth daily. Patient not taking: Reported on 12/25/2021    [provider]    Family History Family History  Problem Relation Age of Onset   Other Father    Breast cancer Paternal Aunt     Social History Social History   Tobacco Use   Smoking status: Former    Packs/day: 0.25    Years: 35.00    Total pack years: 8.75    Types: Cigarettes    Quit date: 05/18/2017    Years since quitting: 5.2   Smokeless tobacco: Never  Vaping Use   Vaping Use: Never used  Substance Use Topics   Alcohol use: No   Drug use: No     Allergies   Coriandrum sativum   Review of Systems Review of Systems  Constitutional:  Positive for fatigue. Negative for chills, diaphoresis and fever.  HENT:  Positive for congestion, rhinorrhea, sinus pressure and sinus pain. Negative for ear pain and sore throat.   Respiratory:  Positive for cough and shortness of breath. Negative for wheezing.    Cardiovascular:  Negative for chest pain.  Gastrointestinal:  Negative for abdominal pain, nausea and vomiting.  Musculoskeletal:  Negative for arthralgias and myalgias.  Skin:  Negative for rash.  Neurological:  Negative for weakness and headaches.  Hematological:  Negative for adenopathy.  Psychiatric/Behavioral:  Positive for sleep disturbance.  Physical Exam Triage Vital Signs ED Triage Vitals [08/22/22 0816]  Enc Vitals Group     BP      Pulse      Resp      Temp      Temp src      SpO2      Weight 260 lb (117.9 kg)     Height 5' (1.524 m)     Head Circumference      Peak Flow      Pain Score 8     Pain Loc      Pain Edu?      Excl. in [place]?    No data found.  Updated Vital Signs BP (!) 157/81 (BP Location: Left Arm)   Pulse 90   Temp 99.4 F (37.4 C) (Oral)   Resp 16   Ht 5' (1.524 m)   Wt 260 lb (117.9 kg)   LMP 06/20/2015 (Approximate)   SpO2 99%   BMI 50.78 kg/m     Physical Exam Vitals and nursing note reviewed.  Constitutional:      General: She is not in acute distress.    Appearance: Normal appearance. She is not ill-appearing or toxic-appearing.  HENT:     Head: Normocephalic and atraumatic.     Nose: Congestion present.     Mouth/Throat:     Mouth: Mucous membranes are moist.     Pharynx: Oropharynx is clear. Posterior oropharyngeal erythema (mild with clear PND) present.  Eyes:     General: No scleral icterus.       Right eye: No discharge.        Left eye: No discharge.     Conjunctiva/sclera: Conjunctivae normal.  Cardiovascular:     Rate and Rhythm: Normal rate and regular rhythm.     Heart sounds: Normal heart sounds.  Pulmonary:     Effort: Pulmonary effort is normal. No respiratory distress.     Breath sounds: Wheezing (LLL) present.  Musculoskeletal:     Cervical back: Neck supple.  Skin:    General: Skin is dry.  Neurological:     General: No focal deficit present.     Mental Status: She is alert. Mental status is at  baseline.     Motor: No weakness.     Gait: Gait normal.  Psychiatric:        Mood and Affect: Mood normal.        Behavior: Behavior normal.        Thought Content: Thought content normal.      UC Treatments / Results  Labs (all labs ordered are listed, but only abnormal results are displayed) Labs Reviewed - No data to display  EKG   Radiology No results found.  Procedures Procedures (including critical care time)  Medications Ordered in UC Medications - No data to display  Initial Impression / Assessment and Plan / UC Course  I have reviewed the triage vital signs and the nursing notes.  Pertinent labs & imaging results that were available during my care of the patient were reviewed by me and considered in my medical decision making (see chart for details).   61 year old female presents for approximately a week and a half history of sinus pressure and congestion, nasal drainage, productive cough and shortness of breath.  Seen 3 weeks ago and had an x-ray which I reviewed.  X-ray shows bronchitis changes.  She was given azithromycin.  Reports improvement for a little while and then worsening of symptoms  over the past week and a half.  States she was feeling well for about a week before symptoms worsened.  Reports using an inhaler and taking decongestants without relief.  She is afebrile and overall well-appearing.  No acute distress.  On exam she has nasal congestion, mild posterior pharyngeal erythema, and wheezes of the left lower lobe.  Possible bronchopneumonia given her previous x-ray and secondary sickening suspected.  Sent doxycycline, Cheratussin and prednisone.  We discussed performing the x-ray but she would like to hold off at this time.  She just had an x-ray at the last visit.  Advised her to return if she develops a fever or has increased weakness or breathing trouble.  Work note given.   Final Clinical Impressions(s) / UC Diagnoses   Final diagnoses:   Bronchopneumonia  Acute cough  Shortness of breath  Acute sinusitis, recurrence not specified, unspecified location     Discharge Instructions      -You have bronchitis bordering on pneumonia and possibly sinus infection.  Antibiotics to pharmacy as well as prednisone and cough medication.  The cough medication contains Mucinex D but do not take any over-the-counter Mucinex.  Plenty rest and fluids.  Use your inhaler as needed for shortness of breath. - Need to return if you develop fever or have any increased breathing difficulty or weakness.  Should be feeling better over the next 7 to 10 days.     ED Prescriptions     Medication Sig Dispense Auth. Provider   doxycycline (VIBRAMYCIN) 100 MG capsule Take 1 capsule (100 mg total) by mouth 2 (two) times daily for 7 days. 14 capsule Laurene Footman B, PA-C   guaiFENesin-codeine (CHERATUSSIN AC) 100-10 MG/5ML syrup Take 10 mLs by mouth 3 (three) times daily as needed for cough. 120 mL Laurene Footman B, PA-C   predniSONE (DELTASONE) 20 MG tablet Take 2 tablets (40 mg total) by mouth daily for 5 days. 10 tablet Danton Clap, PA-C      I have reviewed the PDMP during this encounter.   Danton Clap, PA-C 08/22/22 [phone]

## 2022-08-22 NOTE — ED Triage Notes (Signed)
Patient c/o sinus problem, nasal congestion, and headache that started a week ago.  Patient states that she has had cough and chest congestion for over a week ago.  Patient unsure of fevers.

## 2022-08-22 NOTE — Discharge Instructions (Signed)
-  You have bronchitis bordering on pneumonia and possibly sinus infection.  Antibiotics to pharmacy as well as prednisone and cough medication.  The cough medication contains Mucinex D but do not take any over-the-counter Mucinex.  Plenty rest and fluids.  Use your inhaler as needed for shortness of breath. - Need to return if you develop fever or have any increased breathing difficulty or weakness.  Should be feeling better over the next 7 to 10 days.

## 2022-08-28 ENCOUNTER — Encounter: Payer: Self-pay | Admitting: Emergency Medicine

## 2022-08-28 ENCOUNTER — Ambulatory Visit
Admission: EM | Admit: 2022-08-28 | Discharge: 2022-08-28 | Disposition: A | Discharge status: Home / Self Care | Payer: Medicaid Other | Attending: Emergency Medicine | Admitting: Emergency Medicine

## 2022-08-28 ENCOUNTER — Ambulatory Visit (INDEPENDENT_AMBULATORY_CARE_PROVIDER_SITE_OTHER): Payer: Medicaid Other

## 2022-08-28 DIAGNOSIS — R059 Cough, unspecified: Secondary | ICD-10-CM

## 2022-08-28 DIAGNOSIS — R052 Subacute cough: Secondary | ICD-10-CM | POA: Diagnosis not present

## 2022-08-28 MED ORDER — LEVOFLOXACIN 500 MG PO TABS: 500.0000 mg | ORAL_TABLET | Freq: Every day | ORAL | 0 refills | Status: DC

## 2022-08-28 MED ORDER — PREDNISONE 10 MG (21) PO TBPK
ORAL_TABLET | ORAL | 0 refills | Status: DC
Start: 2022-08-28 — End: 2024-02-02

## 2022-08-28 NOTE — Discharge Instructions (Addendum)
Your chest x-ray did not show any pneumonia but it did show thickening suggestive of airway inflammation.  I would make an appointment with your primary care provider to discuss a referral to pulmonology to be evaluated for this airway inflammation.  In the meantime, stop taking the doxycycline and start taking Levaquin 500 mg once daily for 7 days.  I am going to place you on another round of prednisone as a taper.  Please start this tomorrow morning at breakfast time.  Continue to use the albuterol inhaler every 4-6 hours as needed for shortness of breath or wheezing.

## 2022-08-28 NOTE — ED Provider Notes (Signed)
[facility]    CSN: [id number] Arrival date & time: 08/28/22  1539      History   Chief Complaint Chief Complaint  Patient presents with   Cough    HPI Robin Brooks is a 61 y.o. female.   HPI  61 year old female here for evaluation of respiratory complaints.  Patient was seen here on 1223 and diagnosed with bronchopneumonia and was prescribed prednisone and doxycycline.  She states that since then she feels like her cough and chest congestion has become worse.  She reports that she is coughing up a pink sputum and has associated shortness of breath and wheezing.  She has been using her albuterol inhaler without improvement of symptoms.  She states that it has made her tongue rale and her throat hurt.  She has swelling on her legs but states it is no more than usual.  She denies chest pain.  She has no personal history of CHF but there is a significant history of CHF and her family.  Patient does not display any dyspnea or tachypnea and is able to speak in full sentences without difficulty.  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, High-grade, comedo necrosis ER/PR positive   Colitis    gastric   Dyspnea    GERD (gastroesophageal reflux disease)    Heart murmur    ASYMPTOMATIC   History of methicillin resistant staphylococcus aureus (MRSA) 2013   IBS (irritable bowel syndrome)    Personal history of radiation therapy 2018   Left breast cancer, mammosite   Pneumonia 2003   H/O    Patient Active Problem List   Diagnosis Date Noted   Left hand pain 11/01/2018   Numbness and tingling in left hand 11/01/2018   Morbid obesity with BMI of 45.0-49.9, adult ([place]) 09/20/2018   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   Surgeon: Robert Bellow, MD;  Location: [facility];  Service: General;  Laterality: Left;   BREAST MAMMOSITE Left 05/12/2017   Procedure: MAMMOSITE BREAST;  Surgeon: Robert Bellow, MD;  Location: [facility];  Service: General;  Laterality: Left;   CARPAL TUNNEL RELEASE Left 05/17/2019   Procedure: CARPAL TUNNEL RELEASE ENDOSCOPIC;  Surgeon: Corky Mull, MD;  Location: [facility];  Service: Orthopedics;  Laterality: Left;   CESAREAN SECTION     CHOLECYSTECTOMY     COLONOSCOPY W/ POLYPECTOMY     COLONOSCOPY WITH ESOPHAGOGASTRODUODENOSCOPY (EGD)     FOOT SURGERY     left   TUBAL LIGATION  1995    OB History     Gravida  1   Para      Term      Preterm      AB      Living  1      SAB      IAB      Ectopic      Multiple  Live Births  1        Obstetric Comments  1st Menstrual Cycle:  12 1st Pregnancy: 30           Home Medications    Prior to Admission medications   Medication Sig Start Date End Date Taking? Authorizing Provider  levofloxacin (LEVAQUIN) 500 MG tablet Take 1 tablet (500 mg total) by mouth daily. 08/28/22  Yes Margarette Canada, NP  predniSONE (STERAPRED UNI-PAK 21 TAB) 10 MG (21) TBPK tablet Take 6 tablets on day 1, 5 tablets day 2, 4 tablets day 3, 3 tablets day 4, 2 tablets day 5, 1 tablet day 6 08/28/22  Yes Margarette Canada, NP  albuterol (VENTOLIN HFA) 108 (90 Base) MCG/ACT inhaler Inhale 1-2 puffs into the lungs every 6 (six) hours as needed for wheezing or shortness of breath. 07/30/22   Luvenia Redden, PA-C  calcium carbonate (OS-CAL - DOSED IN MG OF ELEMENTAL CALCIUM) 1250 (500 Ca) MG tablet Take 1 tablet by mouth.    [provider]  cetirizine (ZYRTEC) 10 MG tablet Take 10 mg by  mouth as needed for allergies.    [provider]  FLUoxetine (PROZAC) 20 MG capsule Take 20 mg by mouth daily before breakfast.    [provider]  gabapentin (NEURONTIN) 100 MG capsule Take 600 mg by mouth 2 (two) times daily.    [provider]  guaiFENesin-codeine (CHERATUSSIN AC) 100-10 MG/5ML syrup Take 10 mLs by mouth 3 (three) times daily as needed for cough. 08/22/22   Danton Clap, PA-C  ibuprofen (ADVIL) 200 MG tablet Take 200 mg by mouth in the morning and at bedtime.    [provider]  ketorolac (TORADOL) 10 MG tablet Take 10 mg by mouth every 6 (six) hours as needed. Patient not taking: Reported on 03/10/2022 12/15/21   [provider]  loperamide (IMODIUM A-D) 2 MG tablet Take by mouth.    [provider]  omeprazole (PRILOSEC) 20 MG capsule Take 20 mg by mouth daily before breakfast.     [provider]  raloxifene (EVISTA) 60 MG tablet Take 1 tablet (60 mg total) by mouth daily. 07/29/17   Robert Bellow, MD  Vitamin Mixture (ESTER-C PO) Take 500 mg by mouth daily. Patient not taking: Reported on 12/25/2021    [provider]    Family History Family History  Problem Relation Age of Onset   Other Father    Breast cancer Paternal Aunt     Social History Social History   Tobacco Use   Smoking status: Former    Packs/day: 0.25    Years: 35.00    Total pack years: 8.75    Types: Cigarettes    Quit date: 05/18/2017    Years since quitting: 5.2   Smokeless tobacco: Never  Vaping Use   Vaping Use: Never used  Substance Use Topics   Alcohol use: No   Drug use: No     Allergies   Coriandrum sativum   Review of Systems Review of Systems  Constitutional:  Negative for fever.  HENT:  Positive for sore throat.   Respiratory:  Positive for cough, shortness of breath and wheezing.   Hematological: Negative.   Psychiatric/Behavioral: Negative.       Physical Exam Triage Vital Signs ED  Triage Vitals  Enc Vitals Group     BP 08/28/22 1637 (!) 142/79     Pulse Rate 08/28/22 1637 67     Resp 08/28/22 1637 14  Temp 08/28/22 1637 98.2 F (36.8 C)     Temp Source 08/28/22 1637 Oral     SpO2 08/28/22 1637 95 %     Weight 08/28/22 1635 259 lb 14.8 oz (117.9 kg)     Height 08/28/22 1635 5' (1.524 m)     Head Circumference --      Peak Flow --      Pain Score 08/28/22 1635 0     Pain Loc --      Pain Edu? --      Excl. in [place]? --    No data found.  Updated Vital Signs BP (!) 142/79 (BP Location: Right Arm)   Pulse 67   Temp 98.2 F (36.8 C) (Oral)   Resp 14   Ht 5' (1.524 m)   Wt 259 lb 14.8 oz (117.9 kg)   LMP 06/20/2015 (Approximate)   SpO2 95%   BMI 50.76 kg/m   Visual Acuity Right Eye Distance:   Left Eye Distance:   Bilateral Distance:    Right Eye Near:   Left Eye Near:    Bilateral Near:     Physical Exam Vitals and nursing note reviewed.  Constitutional:      Appearance: Normal appearance. She is not ill-appearing.  HENT:     Head: Normocephalic and atraumatic.     Mouth/Throat:     Mouth: Mucous membranes are moist.     Pharynx: Oropharynx is clear. No oropharyngeal exudate or posterior oropharyngeal erythema.  Cardiovascular:     Rate and Rhythm: Normal rate and regular rhythm.     Pulses: Normal pulses.     Heart sounds: Normal heart sounds. No murmur heard.    No friction rub. No gallop.  Pulmonary:     Effort: Pulmonary effort is normal.     Breath sounds: Normal breath sounds. No wheezing, rhonchi or rales.  Musculoskeletal:     Right lower leg: Edema present.     Left lower leg: Edema present.     Comments: Patient has 1+ pitting edema in bilateral lower extremities.  Skin:    General: Skin is warm and dry.     Capillary Refill: Capillary refill takes less than 2 seconds.     Findings: No erythema.  Neurological:     General: No focal deficit present.     Mental Status: She is alert and oriented to person, place, and time.   Psychiatric:        Mood and Affect: Mood normal.        Behavior: Behavior normal.        Thought Content: Thought content normal.        Judgment: Judgment normal.      UC Treatments / Results  Labs (all labs ordered are listed, but only abnormal results are displayed) Labs Reviewed - No data to display  EKG   Radiology DG Chest 2 View  Result Date: 08/28/2022 CLINICAL DATA:  Cough EXAM: CHEST - 2 VIEW COMPARISON:  07/30/2022 FINDINGS: The lungs are symmetrically well expanded. There is stable perihilar interstitial thickening suggesting airway inflammation. No confluent pulmonary infiltrate. No pneumothorax or pleural effusion. Cardiac size within normal limits. Pulmonary vascularity is normal. No acute bone abnormality. IMPRESSION: 1. Stable perihilar interstitial thickening suggesting airway inflammation. No confluent pulmonary infiltrate. Electronically Signed   By: Fidela Salisbury M.D.   On: 08/28/2022 18:14    Procedures Procedures (including critical care time)  Medications Ordered in UC Medications - No data to display  Initial Impression / Assessment and Plan / UC Course  I have reviewed the triage vital signs and the nursing notes.  Pertinent labs & imaging results that were available during my care of the patient were reviewed by me and considered in my medical decision making (see chart for details).   Patient is a nontoxic-appearing 61 year old female here for evaluation of ongoing respiratory complaints that have been present for the past month.  She was diagnosed 6 days ago with bronchopneumonia and started on doxycycline and prednisone without any improvement of her symptoms.  She was previously prescribed an albuterol inhaler which she has been using without any improvement of her symptoms.  She describes her sputum production as being pink or red.  She is not in any respiratory distress and her lungs are clear to auscultation all fields.  The patient does have  +1 pitting edema to bilateral lower extremities but she states that this edema that she is experiencing is nothing above her baseline.  The patient does not have a personal history of CHF but she does have a strong family history of same.  Her past medical history is significant though for a heart murmur, MRSA, dyspnea, IDA, IBS, GERD, and morbid obesity.  I will order a chest x-ray to look for the presence of pulmonary fluid overload.  Radiology impression of chest x-ray states there is a stable perihilar interstitial thickening suggesting airway inflammation.  No confluent pulmonary infiltrate.  Given that patient has continued symptoms and feels like her cough and congestion is worse I will stop the doxycycline and give her some day course of 500 mg Levaquin.  I will also have her talk to her primary care provider about a referral to pulmonology to be evaluated for possible interstitial lung disease given the findings of her chest x-ray.   Final Clinical Impressions(s) / UC Diagnoses   Final diagnoses:  Subacute cough     Discharge Instructions      Your chest x-ray did not show any pneumonia but it did show thickening suggestive of airway inflammation.  I would make an appointment with your primary care provider to discuss a referral to pulmonology to be evaluated for this airway inflammation.  In the meantime, stop taking the doxycycline and start taking Levaquin 500 mg once daily for 7 days.  I am going to place you on another round of prednisone as a taper.  Please start this tomorrow morning at breakfast time.  Continue to use the albuterol inhaler every 4-6 hours as needed for shortness of breath or wheezing.     ED Prescriptions     Medication Sig Dispense Auth. Provider   levofloxacin (LEVAQUIN) 500 MG tablet Take 1 tablet (500 mg total) by mouth daily. 7 tablet Margarette Canada, NP   predniSONE (STERAPRED UNI-PAK 21 TAB) 10 MG (21) TBPK tablet Take 6 tablets on day 1, 5  tablets day 2, 4 tablets day 3, 3 tablets day 4, 2 tablets day 5, 1 tablet day 6 21 tablet Margarette Canada, NP      PDMP not reviewed this encounter.   Margarette Canada, NP 08/28/22 Vernelle Emerald

## 2022-08-28 NOTE — ED Triage Notes (Signed)
Patient was seen here for 08/22/22 for URI.  Patient states that her cough and chest congestion has not gotten better.

## 2022-09-01 ENCOUNTER — Ambulatory Visit: Admission: RE | Admit: 2022-09-01 | Payer: Medicaid Other | Source: Ambulatory Visit

## 2022-09-01 ENCOUNTER — Encounter: Admission: RE | Payer: Self-pay | Source: Ambulatory Visit

## 2022-09-01 SURGERY — COLONOSCOPY WITH PROPOFOL
Anesthesia: General

## 2022-10-29 ENCOUNTER — Other Ambulatory Visit: Payer: Self-pay | Admitting: Internal Medicine

## 2022-10-29 DIAGNOSIS — Z1231 Encounter for screening mammogram for malignant neoplasm of breast: Secondary | ICD-10-CM

## 2023-02-05 ENCOUNTER — Other Ambulatory Visit: Payer: Self-pay | Admitting: Family Medicine

## 2023-02-05 DIAGNOSIS — Z780 Asymptomatic menopausal state: Secondary | ICD-10-CM

## 2023-02-10 ENCOUNTER — Other Ambulatory Visit: Payer: Self-pay | Admitting: Family Medicine

## 2023-02-10 ENCOUNTER — Ambulatory Visit
Admission: RE | Admit: 2023-02-10 | Discharge: 2023-02-10 | Disposition: A | Discharge status: Home / Self Care | Payer: Medicaid Other | Source: Ambulatory Visit | Attending: Family Medicine | Admitting: Family Medicine

## 2023-02-10 ENCOUNTER — Ambulatory Visit
Admission: RE | Admit: 2023-02-10 | Discharge: 2023-02-10 | Disposition: A | Discharge status: Home / Self Care | Payer: Medicaid Other | Attending: Family Medicine | Admitting: Family Medicine

## 2023-02-10 DIAGNOSIS — M545 Low back pain, unspecified: Secondary | ICD-10-CM | POA: Diagnosis not present

## 2023-05-05 ENCOUNTER — Inpatient Hospital Stay: Admission: RE | Admit: 2023-05-05 | Payer: Medicaid Other | Source: Ambulatory Visit

## 2023-05-05 ENCOUNTER — Other Ambulatory Visit: Payer: Medicaid Other

## 2023-05-10 ENCOUNTER — Encounter: Payer: Self-pay | Admitting: *Deleted

## 2023-05-17 ENCOUNTER — Ambulatory Visit: Payer: 59 | Admitting: Anesthesiology

## 2023-05-17 ENCOUNTER — Encounter: Payer: Self-pay | Admitting: *Deleted

## 2023-05-17 ENCOUNTER — Encounter
Admission: RE | Disposition: A | Discharge status: Home / Self Care | Payer: Self-pay | Source: Home / Self Care | Attending: Gastroenterology

## 2023-05-17 ENCOUNTER — Ambulatory Visit
Admission: RE | Admit: 2023-05-17 | Discharge: 2023-05-17 | Disposition: A | Discharge status: Home / Self Care | Payer: 59 | Attending: Gastroenterology | Admitting: Gastroenterology

## 2023-05-17 DIAGNOSIS — Z6841 Body Mass Index (BMI) 40.0 and over, adult: Secondary | ICD-10-CM | POA: Insufficient documentation

## 2023-05-17 DIAGNOSIS — R1084 Generalized abdominal pain: Secondary | ICD-10-CM | POA: Diagnosis present

## 2023-05-17 DIAGNOSIS — Z8614 Personal history of Methicillin resistant Staphylococcus aureus infection: Secondary | ICD-10-CM | POA: Insufficient documentation

## 2023-05-17 DIAGNOSIS — Z853 Personal history of malignant neoplasm of breast: Secondary | ICD-10-CM | POA: Diagnosis not present

## 2023-05-17 DIAGNOSIS — K5730 Diverticulosis of large intestine without perforation or abscess without bleeding: Secondary | ICD-10-CM | POA: Diagnosis not present

## 2023-05-17 DIAGNOSIS — K640 First degree hemorrhoids: Secondary | ICD-10-CM | POA: Diagnosis not present

## 2023-05-17 DIAGNOSIS — K219 Gastro-esophageal reflux disease without esophagitis: Secondary | ICD-10-CM | POA: Insufficient documentation

## 2023-05-17 DIAGNOSIS — K449 Diaphragmatic hernia without obstruction or gangrene: Secondary | ICD-10-CM | POA: Diagnosis not present

## 2023-05-17 DIAGNOSIS — K317 Polyp of stomach and duodenum: Secondary | ICD-10-CM | POA: Insufficient documentation

## 2023-05-17 DIAGNOSIS — D123 Benign neoplasm of transverse colon: Secondary | ICD-10-CM | POA: Diagnosis not present

## 2023-05-17 DIAGNOSIS — K589 Irritable bowel syndrome without diarrhea: Secondary | ICD-10-CM | POA: Diagnosis not present

## 2023-05-17 HISTORY — PX: POLYPECTOMY: SHX5525

## 2023-05-17 HISTORY — PX: ESOPHAGOGASTRODUODENOSCOPY (EGD) WITH PROPOFOL: SHX5813

## 2023-05-17 HISTORY — PX: BIOPSY: SHX5522

## 2023-05-17 HISTORY — PX: COLONOSCOPY WITH PROPOFOL: SHX5780

## 2023-05-17 SURGERY — COLONOSCOPY WITH PROPOFOL
Anesthesia: General

## 2023-05-17 MED ORDER — LIDOCAINE HCL (CARDIAC) PF 100 MG/5ML IV SOSY
PREFILLED_SYRINGE | INTRAVENOUS | Status: DC | PRN
  Administered 2023-05-17: 100 mg via INTRAVENOUS

## 2023-05-17 MED ORDER — PROPOFOL 10 MG/ML IV BOLUS
INTRAVENOUS | Status: DC | PRN
  Administered 2023-05-17: 40 mg via INTRAVENOUS
  Administered 2023-05-17: 120 mg via INTRAVENOUS
  Administered 2023-05-17: 40 mg via INTRAVENOUS
  Administered 2023-05-17: 50 mg via INTRAVENOUS
  Administered 2023-05-17 (×5): 40 mg via INTRAVENOUS

## 2023-05-17 MED ORDER — SODIUM CHLORIDE 0.9 % IV SOLN
INTRAVENOUS | Status: DC
  Administered 2023-05-17: 1000 mL via INTRAVENOUS

## 2023-05-17 NOTE — Op Note (Signed)
[facility] Gastroenterology Patient Name: Robin Brooks Procedure Date: 05/17/2023 11:42 AM MRN: [id number] Account #: [id number] Date of Birth: 01/01/1961 Admit Type: Outpatient Age: 62 Room: [facility] ENDO ROOM 3 Gender: Female Note Status: Finalized Instrument Name: Prentice Docker [id number] Procedure:             Colonoscopy Indications:           Generalized abdominal pain Providers:             Eather Colas MD, MD Medicines:             Monitored Anesthesia Care Complications:         No immediate complications. Estimated blood loss:                         Minimal. Procedure:             Pre-Anesthesia Assessment:                        - Prior to the procedure, a History and Physical was                         performed, and patient medications and allergies were                         reviewed. The patient is competent. The risks and                         benefits of the procedure and the sedation options and                         risks were discussed with the patient. All questions                         were answered and informed consent was obtained.                         Patient identification and proposed procedure were                         verified by the physician, the nurse, the                         anesthesiologist, the anesthetist and the technician                         in the endoscopy suite. Mental Status Examination:                         alert and oriented. Airway Examination: normal                         oropharyngeal airway and neck mobility. Respiratory                         Examination: clear to auscultation. CV Examination:                         normal. Prophylactic Antibiotics: The patient does not  require prophylactic antibiotics. Prior                         Anticoagulants: The patient has taken no anticoagulant                         or antiplatelet agents. ASA Grade Assessment: III - A                          patient with severe systemic disease. After reviewing                         the risks and benefits, the patient was deemed in                         satisfactory condition to undergo the procedure. The                         anesthesia plan was to use monitored anesthesia care                         (MAC). Immediately prior to administration of                         medications, the patient was re-assessed for adequacy                         to receive sedatives. The heart rate, respiratory                         rate, oxygen saturations, blood pressure, adequacy of                         pulmonary ventilation, and response to care were                         monitored throughout the procedure. The physical                         status of the patient was re-assessed after the                         procedure.                        After obtaining informed consent, the colonoscope was                         passed under direct vision. Throughout the procedure,                         the patient's blood pressure, pulse, and oxygen                         saturations were monitored continuously. The                         Colonoscope was introduced through the anus and  advanced to the the terminal ileum. The colonoscopy                         was performed without difficulty. The patient                         tolerated the procedure well. The quality of the bowel                         preparation was adequate to identify polyps. The                         terminal ileum, ileocecal valve, appendiceal orifice,                         and rectum were photographed. Findings:      The perianal and digital rectal examinations were normal.      The terminal ileum appeared normal.      Two sessile polyps were found in the transverse colon. The polyps were 2       to 3 mm in size. These polyps were removed with a cold snare.  Resection       and retrieval were complete. Estimated blood loss was minimal.      Internal hemorrhoids were found during retroflexion. The hemorrhoids       were Grade I (internal hemorrhoids that do not prolapse).      A few small-mouthed diverticula were found in the sigmoid colon.      The exam was otherwise without abnormality on direct and retroflexion       views. Impression:            - The examined portion of the ileum was normal.                        - Two 2 to 3 mm polyps in the transverse colon,                         removed with a cold snare. Resected and retrieved.                        - Internal hemorrhoids.                        - Diverticulosis in the sigmoid colon.                        - The examination was otherwise normal on direct and                         retroflexion views. Recommendation:        - Discharge patient to home.                        - Resume previous diet.                        - Continue present medications.                        - Await pathology results.                        -  Repeat colonoscopy date to be determined after                         pending pathology results are reviewed for                         surveillance based on pathology results.                        - Return to referring physician as previously                         scheduled. Procedure Code(s):     --- Professional ---                        [phone], Colonoscopy, flexible; with removal of                         tumor(s), polyp(s), or other lesion(s) by snare                         technique Diagnosis Code(s):     --- Professional ---                        K64.0, First degree hemorrhoids                        D12.3, Benign neoplasm of transverse colon (hepatic                         flexure or splenic flexure)                        R10.84, Generalized abdominal pain                        K57.30, Diverticulosis of large intestine without                          perforation or abscess without bleeding CPT copyright 2022 American Medical Association. All rights reserved. The codes documented in this report are preliminary and upon coder review may  be revised to meet current compliance requirements. Eather Colas MD, MD 05/17/2023 12:10:47 PM Number of Addenda: 0 Note Initiated On: 05/17/2023 11:42 AM Scope Withdrawal Time: 0 hours 9 minutes 52 seconds  Total Procedure Duration: 0 hours 16 minutes 3 seconds  Estimated Blood Loss:  Estimated blood loss was minimal.      [facility]

## 2023-05-17 NOTE — Interval H&P Note (Signed)
History and Physical Interval Note:  05/17/2023 11:29 AM  Robin Brooks  has presented today for surgery, with the diagnosis of Gen abd pain.  The various methods of treatment have been discussed with the patient and family. After consideration of risks, benefits and other options for treatment, the patient has consented to  Procedure(s): COLONOSCOPY WITH PROPOFOL (N/A) ESOPHAGOGASTRODUODENOSCOPY (EGD) WITH PROPOFOL (N/A) as a surgical intervention.  The patient's history has been reviewed, patient examined, no change in status, stable for surgery.  I have reviewed the patient's chart and labs.  Questions were answered to the patient's satisfaction.     Regis Bill  Ok to proceed with EGD/Colonoscopy

## 2023-05-17 NOTE — H&P (Signed)
Outpatient short stay form Pre-procedure 05/17/2023  Regis Bill, MD  Primary Physician: Jannet Askew, MD  Reason for visit:  Abdominal pain  History of present illness:    62 y/o lady with morbid obesity and IBS here for EGD/Colonoscopy for generalized abdominal pain. Last colonoscopy was in 2015. No blood thinners. No family history of GI malignancies. No significant abdominal surgeries.    Current Facility-Administered Medications:    0.9 %  sodium chloride infusion, , Intravenous, Continuous, Gaston Dase, Rossie Muskrat, MD, Last Rate: 20 mL/hr at 05/17/23 1124, Continued from Pre-op at 05/17/23 1124  Medications Prior to Admission  Medication Sig Dispense Refill Last Dose   cetirizine (ZYRTEC) 10 MG tablet Take 10 mg by mouth as needed for allergies.   05/16/2023   FLUoxetine (PROZAC) 20 MG capsule Take 20 mg by mouth daily before breakfast.   05/16/2023   gabapentin (NEURONTIN) 100 MG capsule Take 600 mg by mouth 2 (two) times daily.   05/16/2023   loperamide (IMODIUM A-D) 2 MG tablet Take by mouth.   Past Month   omeprazole (PRILOSEC) 20 MG capsule Take 20 mg by mouth daily before breakfast.    05/16/2023   albuterol (VENTOLIN HFA) 108 (90 Base) MCG/ACT inhaler Inhale 1-2 puffs into the lungs every 6 (six) hours as needed for wheezing or shortness of breath. 18 g 0    calcium carbonate (OS-CAL - DOSED IN MG OF ELEMENTAL CALCIUM) 1250 (500 Ca) MG tablet Take 1 tablet by mouth.      guaiFENesin-codeine (CHERATUSSIN AC) 100-10 MG/5ML syrup Take 10 mLs by mouth 3 (three) times daily as needed for cough. 120 mL 0    ibuprofen (ADVIL) 200 MG tablet Take 200 mg by mouth in the morning and at bedtime.    at prn   ketorolac (TORADOL) 10 MG tablet Take 10 mg by mouth every 6 (six) hours as needed.      levofloxacin (LEVAQUIN) 500 MG tablet Take 1 tablet (500 mg total) by mouth daily. 7 tablet 0    predniSONE (STERAPRED UNI-PAK 21 TAB) 10 MG (21) TBPK tablet Take 6 tablets on day 1, 5 tablets  day 2, 4 tablets day 3, 3 tablets day 4, 2 tablets day 5, 1 tablet day 6 21 tablet 0    raloxifene (EVISTA) 60 MG tablet Take 1 tablet (60 mg total) by mouth daily. 90 tablet 3    Vitamin Mixture (ESTER-C PO) Take 500 mg by mouth daily.        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    Physical Exam  Gen: Alert, oriented. Appears stated age.  HEENT: PERRLA. Lungs: No respiratory distress CV: RRR Abd: soft, benign, no masses Ext: No edema    Planned procedures: Proceed with EGD/colonoscopy. The patient understands the nature  of the planned procedure, indications, risks, alternatives and potential complications including but not limited to bleeding, infection, perforation, damage to internal organs and possible oversedation/side effects from anesthesia. The patient agrees and gives consent to proceed.  Please refer to procedure notes for findings, recommendations and patient disposition/instructions.     Regis Bill, MD [facility] Gastroenterology

## 2023-05-17 NOTE — Transfer of Care (Signed)
Immediate Anesthesia Transfer of Care Note  Patient: Robin Brooks  Procedure(s) Performed: COLONOSCOPY WITH PROPOFOL ESOPHAGOGASTRODUODENOSCOPY (EGD) WITH PROPOFOL BIOPSY  Patient Location: Endoscopy Unit  Anesthesia Type:General  Level of Consciousness: awake, alert , and oriented  Airway & Oxygen Therapy: Patient Spontanous Breathing  Post-op Assessment: Report given to RN and Post -op Vital signs reviewed and stable  Post vital signs: Reviewed and stable  Last Vitals:  Vitals Value Taken Time  BP 123/67 05/17/23 1206  Temp 36.1 C 05/17/23 1204  Pulse 72 05/17/23 1206  Resp 15 05/17/23 1206  SpO2 99 % 05/17/23 1206  Vitals shown include unfiled device data.  Last Pain:  Vitals:   05/17/23 1204  TempSrc: Temporal  PainSc:          Complications: No notable events documented.

## 2023-05-17 NOTE — Anesthesia Preprocedure Evaluation (Signed)
Anesthesia Evaluation  Patient identified by MRN, date of birth, ID band Patient awake    Reviewed: Allergy & Precautions, NPO status , Patient's Chart, lab work & pertinent test results  History of Anesthesia Complications Negative for: history of anesthetic complications  Airway Mallampati: I  TM Distance: >3 FB Neck ROM: Full    Dental  (+) Edentulous Upper   Pulmonary neg sleep apnea, neg COPD, Patient abstained from smoking.Not current smoker, former smoker   Pulmonary exam normal breath sounds clear to auscultation       Cardiovascular Exercise Tolerance: Good METS(-) hypertension(-) CAD and (-) Past MI negative cardio ROS (-) dysrhythmias  Rhythm:Regular Rate:Normal - Systolic murmurs    Neuro/Psych  PSYCHIATRIC DISORDERS Anxiety     negative neurological ROS     GI/Hepatic ,GERD  Controlled and Medicated,,(+)     (-) substance abuse    Endo/Other  neg diabetes  Morbid obesity  Renal/GU negative Renal ROS     Musculoskeletal   Abdominal  (+) + obese  Peds  Hematology   Anesthesia Other Findings Past Medical History: No date: Allergy No date: Anemia No date: Anxiety No date: Arthritis 2018: Breast cancer (HCC)     Comment:  DCIS, High-grade, comedo necrosis ER/PR positive No date: Colitis     Comment:  gastric No date: Dyspnea No date: GERD (gastroesophageal reflux disease) No date: Heart murmur     Comment:  ASYMPTOMATIC 2013: History of methicillin resistant staphylococcus aureus (MRSA) No date: IBS (irritable bowel syndrome) 2018: Personal history of radiation therapy     Comment:  Left breast cancer, mammosite 2003: Pneumonia     Comment:  H/O  Reproductive/Obstetrics                              Anesthesia Physical Anesthesia Plan  ASA: 3  Anesthesia Plan: General   Post-op Pain Management: Minimal or no pain anticipated   Induction: Intravenous  PONV  Risk Score and Plan: 3 and Propofol infusion, TIVA and Ondansetron  Airway Management Planned: Nasal Cannula  Additional Equipment: None  Intra-op Plan:   Post-operative Plan:   Informed Consent: I have reviewed the patients History and Physical, chart, labs and discussed the procedure including the risks, benefits and alternatives for the proposed anesthesia with the patient or authorized representative who has indicated his/her understanding and acceptance.     Dental advisory given  Plan Discussed with: CRNA and Surgeon  Anesthesia Plan Comments: (Discussed risks of anesthesia with patient, including possibility of difficulty with spontaneous ventilation under anesthesia necessitating airway intervention, PONV, and rare risks such as cardiac or respiratory or neurological events, and allergic reactions. Discussed the role of CRNA in patient's perioperative care. Patient understands.)         Anesthesia Quick Evaluation

## 2023-05-17 NOTE — Op Note (Signed)
[facility] Gastroenterology Patient Name: Robin Brooks Procedure Date: 05/17/2023 11:24 AM MRN: [id number] Account #: [id number] Date of Birth: 07-Jul-1961 Admit Type: Outpatient Age: 62 Room: [facility] ENDO ROOM 3 Gender: Female Note Status: Finalized Instrument Name: Laurette Schimke [id number] Procedure:             Upper GI endoscopy Indications:           Generalized abdominal pain Providers:             Eather Colas MD, MD Medicines:             Monitored Anesthesia Care Complications:         No immediate complications. Estimated blood loss:                         Minimal. Procedure:             Pre-Anesthesia Assessment:                        - Prior to the procedure, a History and Physical was                         performed, and patient medications and allergies were                         reviewed. The patient is competent. The risks and                         benefits of the procedure and the sedation options and                         risks were discussed with the patient. All questions                         were answered and informed consent was obtained.                         Patient identification and proposed procedure were                         verified by the physician, the nurse, the                         anesthesiologist, the anesthetist and the technician                         in the endoscopy suite. Mental Status Examination:                         alert and oriented. Airway Examination: normal                         oropharyngeal airway and neck mobility. Respiratory                         Examination: clear to auscultation. CV Examination:                         normal. Prophylactic Antibiotics: The patient does not  require prophylactic antibiotics. Prior                         Anticoagulants: The patient has taken no anticoagulant                         or antiplatelet agents. ASA Grade  Assessment: III - A                         patient with severe systemic disease. After reviewing                         the risks and benefits, the patient was deemed in                         satisfactory condition to undergo the procedure. The                         anesthesia plan was to use monitored anesthesia care                         (MAC). Immediately prior to administration of                         medications, the patient was re-assessed for adequacy                         to receive sedatives. The heart rate, respiratory                         rate, oxygen saturations, blood pressure, adequacy of                         pulmonary ventilation, and response to care were                         monitored throughout the procedure. The physical                         status of the patient was re-assessed after the                         procedure.                        After obtaining informed consent, the endoscope was                         passed under direct vision. Throughout the procedure,                         the patient's blood pressure, pulse, and oxygen                         saturations were monitored continuously. The Endoscope                         was introduced through the mouth, and advanced to the  second part of duodenum. The upper GI endoscopy was                         somewhat difficult due to the patient's body habitus                         and the patient's oxygen desaturation. Successful                         completion of the procedure was aided by managing the                         patient's medical instability. The patient tolerated                         the procedure well. Findings:      A small hiatal hernia was present.      The exam of the esophagus was otherwise normal.      A single 10 mm sessile polyp with no bleeding and no stigmata of recent       bleeding was found in the gastric fundus. Given  appearance, possible       neuroendocrine tumor. Biopsies were taken with a cold forceps for       histology. Estimated blood loss was minimal.      Multiple small sessile fundic gland polyps with no bleeding and no       stigmata of recent bleeding were found in the gastric fundus and in the       gastric body.      The examined duodenum was normal. Impression:            - Small hiatal hernia.                        - A single gastric polyp. Biopsied. If biopsy                         consistent with NET, then would need repeat EGD for                         removal. Would consider intubation due to respiratory                         issues.                        - Multiple fundic gland polyps.                        - Normal examined duodenum. Recommendation:        - Await pathology results.                        - Perform a colonoscopy today. Procedure Code(s):     --- Professional ---                        [phone], Esophagogastroduodenoscopy, flexible,                         transoral; with biopsy, single or multiple Diagnosis  Code(s):     --- Professional ---                        K44.9, Diaphragmatic hernia without obstruction or                         gangrene                        K31.7, Polyp of stomach and duodenum                        R10.84, Generalized abdominal pain CPT copyright 2022 American Medical Association. All rights reserved. The codes documented in this report are preliminary and upon coder review may  be revised to meet current compliance requirements. Eather Colas MD, MD 05/17/2023 12:07:08 PM Number of Addenda: 0 Note Initiated On: 05/17/2023 11:24 AM Estimated Blood Loss:  Estimated blood loss was minimal.      [facility]

## 2023-05-18 ENCOUNTER — Encounter: Payer: Self-pay | Admitting: Gastroenterology

## 2023-05-18 LAB — SURGICAL PATHOLOGY

## 2023-05-18 NOTE — Anesthesia Postprocedure Evaluation (Signed)
Anesthesia Post Note  Patient: Robin Brooks  Procedure(s) Performed: COLONOSCOPY WITH PROPOFOL ESOPHAGOGASTRODUODENOSCOPY (EGD) WITH PROPOFOL BIOPSY POLYPECTOMY  Patient location during evaluation: Endoscopy Anesthesia Type: General Level of consciousness: awake and alert Pain management: pain level controlled Vital Signs Assessment: post-procedure vital signs reviewed and stable Respiratory status: spontaneous breathing, nonlabored ventilation, respiratory function stable and patient connected to nasal cannula oxygen Cardiovascular status: blood pressure returned to baseline and stable Postop Assessment: no apparent nausea or vomiting Anesthetic complications: no   No notable events documented.   Last Vitals:  Vitals:   05/17/23 1214 05/17/23 1224  BP: 111/71 119/62  Pulse:    Resp:    Temp:    SpO2:      Last Pain:  Vitals:   05/17/23 1224  TempSrc:   PainSc: 0-No pain                 Corinda Gubler

## 2024-02-02 ENCOUNTER — Ambulatory Visit
Admission: RE | Admit: 2024-02-02 | Discharge: 2024-02-02 | Disposition: A | Discharge status: Home / Self Care | Payer: Self-pay | Source: Ambulatory Visit | Attending: Family Medicine | Admitting: Family Medicine

## 2024-02-02 VITALS — BP 127/77 | HR 76 | Temp 98.2°F | Resp 16 | Ht 61.0 in | Wt 260.0 lb

## 2024-02-02 DIAGNOSIS — L989 Disorder of the skin and subcutaneous tissue, unspecified: Secondary | ICD-10-CM

## 2024-02-02 MED ORDER — TRIAMCINOLONE ACETONIDE 0.1 % EX OINT: 1.0000 | TOPICAL_OINTMENT | Freq: Two times a day (BID) | CUTANEOUS | 0 refills | Status: DC

## 2024-02-02 MED ORDER — DOXYCYCLINE HYCLATE 100 MG PO CAPS: 100.0000 mg | ORAL_CAPSULE | Freq: Two times a day (BID) | ORAL | 0 refills | Status: DC

## 2024-02-02 MED ORDER — NYSTATIN-TRIAMCINOLONE 100000-0.1 UNIT/GM-% EX OINT: 1.0000 | TOPICAL_OINTMENT | Freq: Two times a day (BID) | CUTANEOUS | 0 refills | Status: DC

## 2024-02-02 NOTE — ED Provider Notes (Signed)
 [facility]    CSN: [id number] Arrival date & time: 02/02/24  1641      History   Chief Complaint Chief Complaint  Patient presents with   Abscess    HPI HALAH WHITESIDE is a 63 y.o. female.   HPI  Videl presents for red spot on her leg for 3 months or more. It gets better then it gets really bad again.  It's been bad for a while now. Touching it makes it worse. It itches a lot. It "feels like its from the inside." Has history of MRSA. No drainage or fever. Has some new spots near it. She used to apply Hibiclens.  She called the [facility] who told her they didn't need to see her. Has an appointment with a dermatologist later this month but she is very concerned that she has an MRSA infection.  No fever. [place] a lot.  She doesn't have any animals.    In the past 3-4 days, she has had a "naggy headache."  She sleeps in her recliner to help herself breathe from the nasal congestion. She smoked for 37 years but quit 6 years. Has chronic cough. Says she had a similar headache when she had a MRSA infection in the past.   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 No known injury.   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, High-grade, comedo necrosis ER/PR positive   Colitis    gastric   Dyspnea    GERD (gastroesophageal reflux disease)    Heart murmur    ASYMPTOMATIC   History of methicillin resistant staphylococcus aureus (MRSA) 2013   IBS (irritable bowel syndrome)    Personal history of radiation therapy 2018   Left breast cancer, mammosite   Pneumonia 2003   H/O    Patient Active Problem List   Diagnosis Date Noted   Left hand pain 11/01/2018   Numbness and tingling in left hand 11/01/2018   Morbid obesity with BMI of 45.0-49.9, adult (HCC) 09/20/2018   Ductal carcinoma in situ (DCIS) of left breast  03/11/2017   IDA (iron deficiency anemia) 02/27/2014   Allergic rhinitis 01/30/2014   GERD (gastroesophageal reflux disease) 01/30/2014   IBS (irritable bowel syndrome) 01/30/2014    Past Surgical History:  Procedure Laterality Date   BIOPSY  05/17/2023   Procedure: BIOPSY;  Surgeon: Shane Darling, MD;  Location: [facility];  Service: Endoscopy;;   BREAST BIOPSY Left 03/02/2017   DCIS   BREAST CYST EXCISION Left 80s   neg   BREAST LUMPECTOMY Left 04/19/2017   Wide excision high grade DCIS, ER/PR positive.   Surgeon: Marshall Skeeter, MD;  Location: [facility] ORS;  Service: General;  Laterality: Left;   BREAST MAMMOSITE Left 05/12/2017   Procedure: MAMMOSITE BREAST;  Surgeon: Marshall Skeeter, MD;  Location: [facility];  Service: General;  Laterality: Left;   CARPAL TUNNEL RELEASE Left 05/17/2019   Procedure: CARPAL TUNNEL RELEASE ENDOSCOPIC;  Surgeon: Elner Hahn, MD;  Location: [facility];  Service: Orthopedics;  Laterality: Left;   CESAREAN SECTION     CHOLECYSTECTOMY     COLONOSCOPY W/ POLYPECTOMY     COLONOSCOPY WITH ESOPHAGOGASTRODUODENOSCOPY (EGD)     COLONOSCOPY WITH PROPOFOL  N/A 05/17/2023   Procedure: COLONOSCOPY  WITH PROPOFOL ;  Surgeon: Shane Darling, MD;  Location: [facility];  Service: Endoscopy;  Laterality: N/A;   ESOPHAGOGASTRODUODENOSCOPY (EGD) WITH PROPOFOL  N/A 05/17/2023   Procedure: ESOPHAGOGASTRODUODENOSCOPY (EGD) WITH PROPOFOL ;  Surgeon: Shane Darling, MD;  Location: [facility];  Service: Endoscopy;  Laterality: N/A;   FOOT SURGERY     left   POLYPECTOMY  05/17/2023   Procedure: POLYPECTOMY;  Surgeon: Shane Darling, MD;  Location: [facility];  Service: Endoscopy;;   TUBAL LIGATION  1995    OB History     Gravida  1   Para      Term      Preterm      AB      Living  1      SAB      IAB      Ectopic      Multiple      Live Births  1        Obstetric Comments  1st Menstrual Cycle:  12 1st Pregnancy:  30           Home Medications    Prior to Admission medications   Medication Sig Start Date End Date Taking? Authorizing Provider  doxycycline  (VIBRAMYCIN ) 100 MG capsule Take 1 capsule (100 mg total) by mouth 2 (two) times daily. 02/02/24  Yes Eyana Stolze, DO  nystatin-triamcinolone ointment (MYCOLOG) Apply 1 Application topically 2 (two) times daily. 02/02/24  Yes Jaleen Finch, DO  triamcinolone ointment (KENALOG) 0.1 % Apply 1 Application topically 2 (two) times daily. 02/02/24  Yes Frans Valente, DO  albuterol  (VENTOLIN  HFA) 108 (90 Base) MCG/ACT inhaler Inhale 1-2 puffs into the lungs every 6 (six) hours as needed for wheezing or shortness of breath. 07/30/22  Yes Sharin David, PA-C  calcium carbonate (OS-CAL - DOSED IN MG OF ELEMENTAL CALCIUM) 1250 (500 Ca) MG tablet Take 1 tablet by mouth.   Yes [provider]  cetirizine (ZYRTEC) 10 MG tablet Take 10 mg by mouth as needed for allergies.   Yes [provider]  FLUoxetine (PROZAC) 20 MG capsule Take 20 mg by mouth daily before breakfast.   Yes [provider]  gabapentin  (NEURONTIN ) 100 MG capsule Take 600 mg by mouth 2 (two) times daily.   Yes [provider]  ibuprofen (ADVIL) 200 MG tablet Take 200 mg by mouth in the morning and at bedtime.   Yes [provider]  ketorolac  (TORADOL ) 10 MG tablet Take 10 mg by mouth every 6 (six) hours as needed. 12/15/21  Yes [provider]  loperamide (IMODIUM A-D) 2 MG tablet Take by mouth.   Yes [provider]  omeprazole (PRILOSEC) 20 MG capsule Take 20 mg by mouth daily before breakfast.    Yes [provider]  raloxifene  (EVISTA ) 60 MG tablet Take 1 tablet (60 mg total) by mouth daily. 07/29/17  Yes Byrnett, Magali Schmitz, MD  Vitamin Mixture (ESTER-C PO) Take 500 mg by mouth daily.   Yes [provider]    Family History Family History  Problem Relation Age of Onset   Other Father    Breast cancer  Paternal Aunt     Social History Social History   Tobacco Use   Smoking status: Former    Current packs/day: 0.00    Average packs/day: 0.3 packs/day for 35.0 years (8.8 ttl pk-yrs)    Types: Cigarettes    Start date: 05/18/1982    Quit date: 05/18/2017    Years since quitting:  6.7   Smokeless tobacco: Never  Vaping Use   Vaping status: Never Used  Substance Use Topics   Alcohol use: No   Drug use: No     Allergies   Coriandrum sativum   Review of Systems Review of Systems :negative unless otherwise stated in HPI.      Physical Exam Triage Vital Signs ED Triage Vitals  Encounter Vitals Group     BP 02/02/24 1649 127/77     Systolic BP Percentile --      Diastolic BP Percentile --      Pulse Rate 02/02/24 1649 76     Resp 02/02/24 1649 16     Temp 02/02/24 1649 98.2 F (36.8 C)     Temp Source 02/02/24 1649 Oral     SpO2 02/02/24 1649 95 %     Weight 02/02/24 1648 260 lb (117.9 kg)     Height 02/02/24 1648 5\' 1"  (1.549 m)     Head Circumference --      Peak Flow --      Pain Score 02/02/24 1652 8     Pain Loc --      Pain Education --      Exclude from Growth Chart --    No data found.  Updated Vital Signs BP 127/77 (BP Location: Left Arm)   Pulse 76   Temp 98.2 F (36.8 C) (Oral)   Resp 16   Ht 5\' 1"  (1.549 m)   Wt 117.9 kg   LMP 06/20/2015 (Approximate)   SpO2 95%   BMI 49.13 kg/m   Visual Acuity Right Eye Distance:   Left Eye Distance:   Bilateral Distance:    Right Eye Near:   Left Eye Near:    Bilateral Near:     Physical Exam  GEN: alert, well appearing female, in no acute distress  EYES: no scleral injection or discharge RESP: no increased work of breathing MSK: no extremity edema  SKIN: warm and dry; erythematous patch with central scaly lesion and satellite lesions surrounding it      UC Treatments / Results  Labs (all labs ordered are listed, but only abnormal results are displayed) Labs Reviewed - No data to  display  EKG   Radiology No results found.  Procedures Procedures (including critical care time)  Medications Ordered in UC Medications - No data to display  Initial Impression / Assessment and Plan / UC Course  I have reviewed the triage vital signs and the nursing notes.  Pertinent labs & imaging results that were available during my care of the patient were reviewed by me and considered in my medical decision making (see chart for details).     Patient is a 63 y.o. femalewho presents for 3 month right lower leg rash.  Overall, patient is well-appearing and well-hydrated.  Vital signs stable.  Manika is afebrile.  Differential diagnosis includes but not limited to cellulitis, fungal infection, dermatitis, chronic leg wound, vasculitis,   Her rash is concerning for possible cellulitis but I cannot rule out contact dermatitis or fungal infection based off her history.  Will treat broadly with antibiotics, antifungal cream and steroid ointment. Not likely viral exanthem.   Reviewed expectations regarding course of current medical issues.  All questions asked were answered.  Outlined signs and symptoms indicating need for more acute intervention. Patient verbalized understanding. After Visit Summary given.   Final Clinical Impressions(s) / UC Diagnoses   Final diagnoses:  Skin lesion of right leg  Discharge Instructions      Stop by the pharmacy to pick up your prescriptions.  Follow up with your dermatologist as scheduled, if not improving.      ED Prescriptions     Medication Sig Dispense Auth. Provider   nystatin-triamcinolone ointment (MYCOLOG) Apply 1 Application topically 2 (two) times daily. 30 g Mattie Nordell, DO   triamcinolone ointment (KENALOG) 0.1 % Apply 1 Application topically 2 (two) times daily. 30 g Parag Dorton, DO   doxycycline  (VIBRAMYCIN ) 100 MG capsule Take 1 capsule (100 mg total) by mouth 2 (two) times daily. 20 capsule Hadasah Brugger, DO       PDMP not reviewed this encounter.              Jaydee Ingman, DO 02/03/24 1437

## 2024-02-02 NOTE — Discharge Instructions (Signed)
 Stop by the pharmacy to pick up your prescriptions.  Follow up with your dermatologist as scheduled, if not improving.

## 2024-02-02 NOTE — ED Triage Notes (Signed)
 Pt c/o spot in R lower leg x3 mon. Area red,tender & itchy. Denies any drainage.

## 2024-03-08 IMAGING — CR DG KNEE COMPLETE 4+V*L*
5 series · 5 of 5 positions shown · non-contrast
Comparison: None.

CLINICAL DATA: Intermittent pain for 1 year.  Worse at night.

EXAM:
LEFT KNEE - COMPLETE 4+ VIEW

[knee ap]
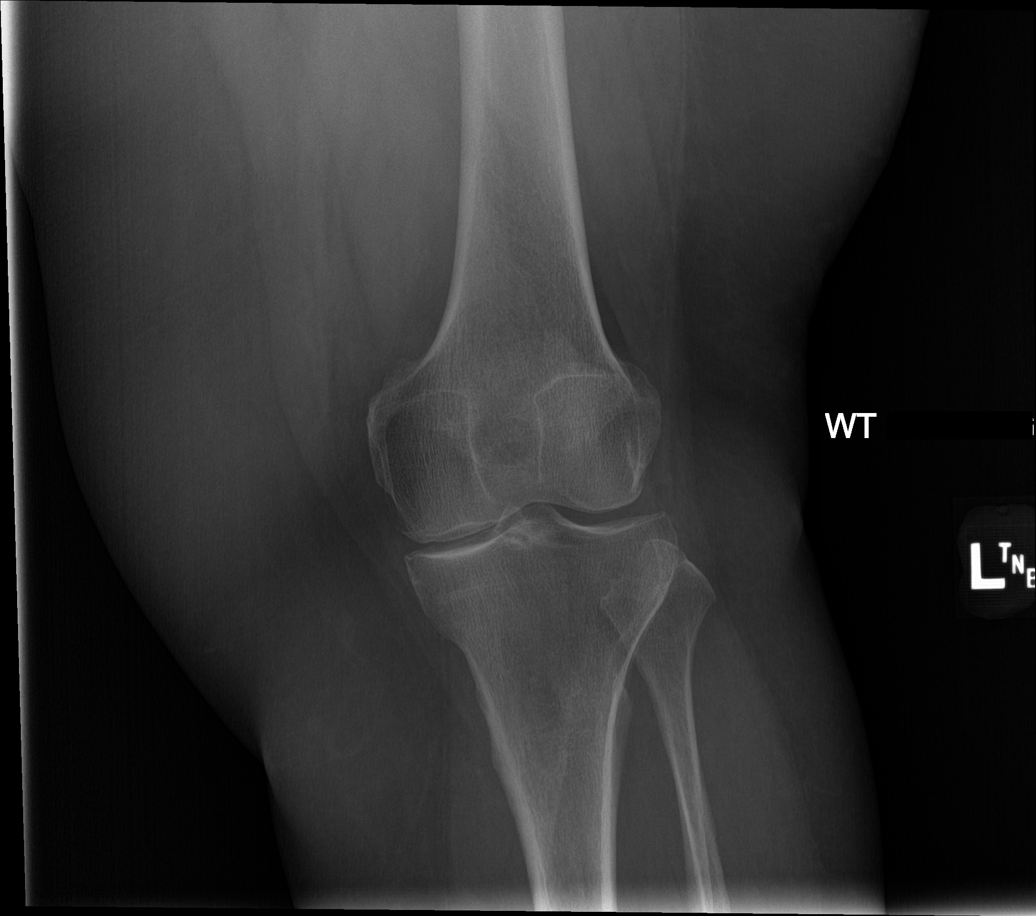

[knee lat (1 of 2)]
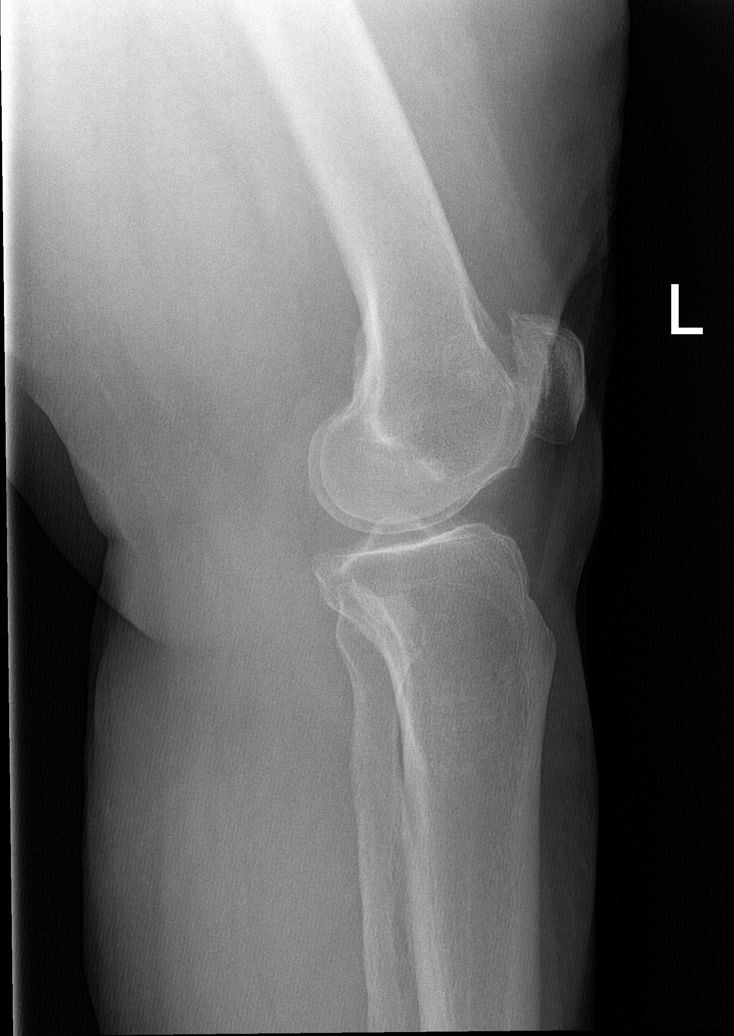

[knee obl (1 of 2)]
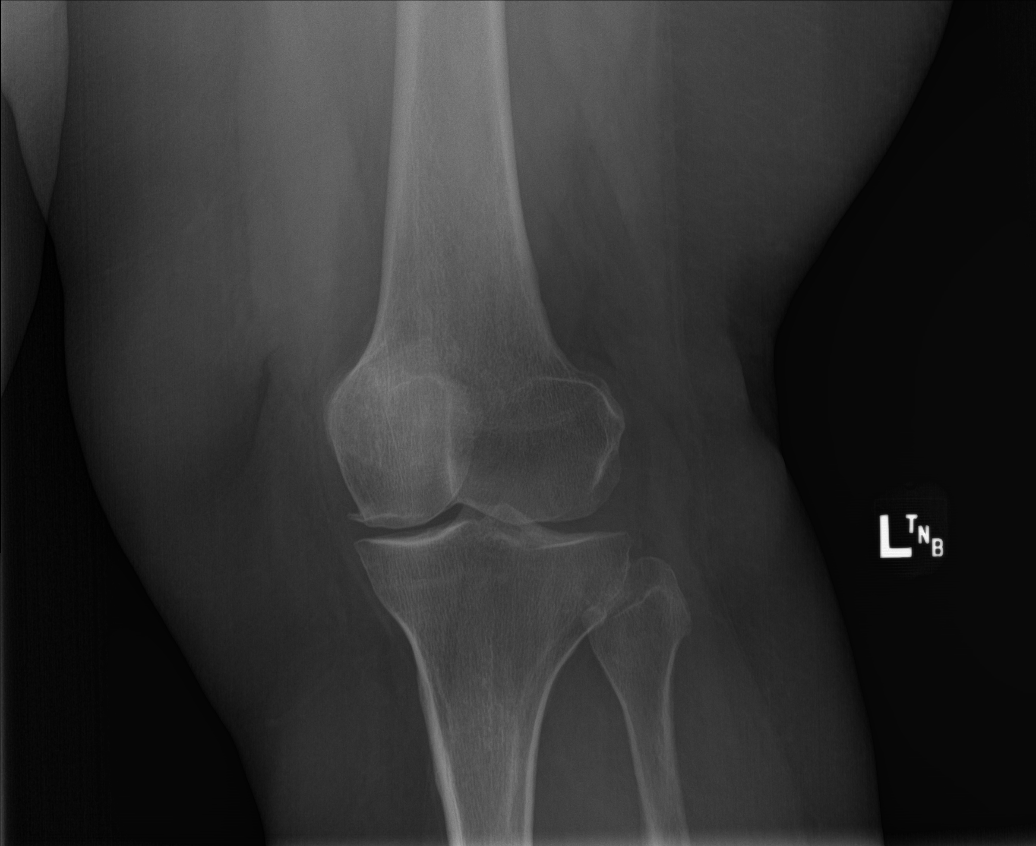

[knee obl (2 of 2)]
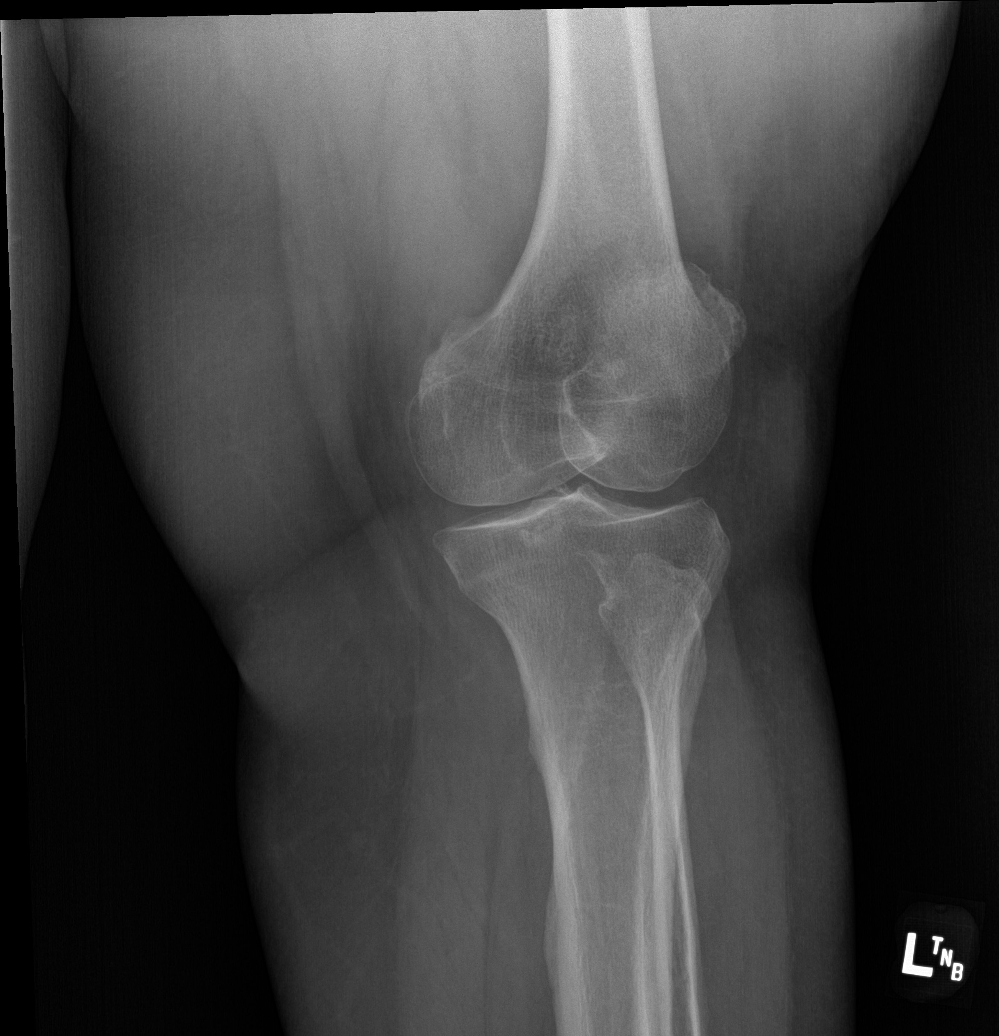

[knee lat (2 of 2)]
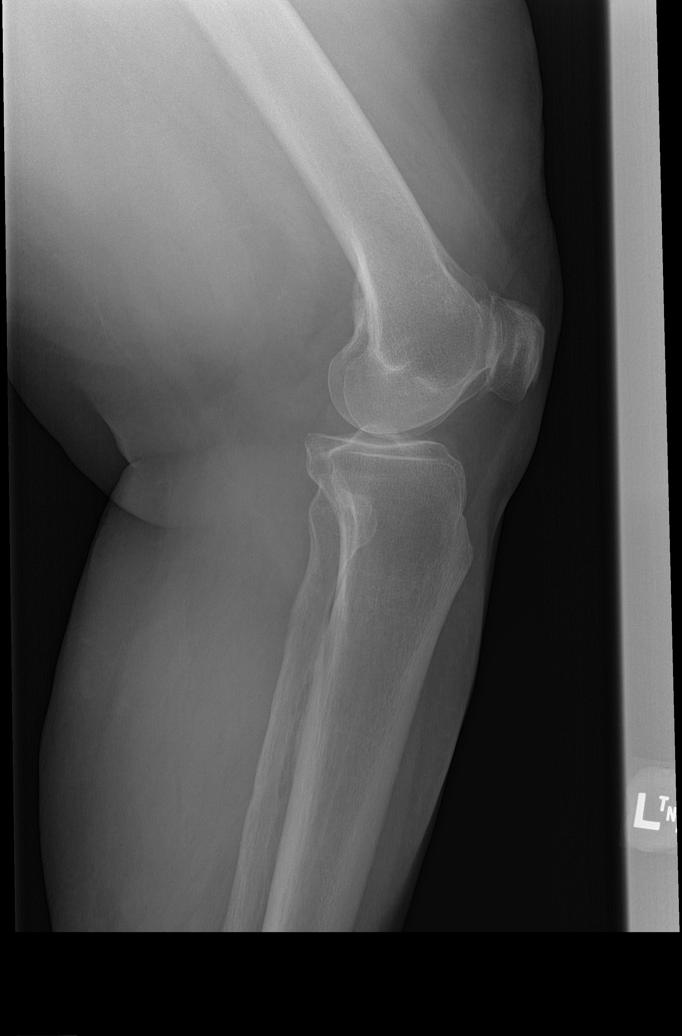

[5 of 5 positions shown; findings below may reference images not displayed]

FINDINGS: There is diffuse decreased bone mineralization. Mild-to-moderate
medial compartment joint space narrowing and mild peripheral
degenerative osteophytes. Moderate patellofemoral joint space
narrowing with mild peripheral osteophytes. No acute fracture is
seen. No dislocation.
IMPRESSION: Mild-to-moderate medial and patellofemoral compartment
osteoarthritis.

## 2024-04-13 ENCOUNTER — Ambulatory Visit (INDEPENDENT_AMBULATORY_CARE_PROVIDER_SITE_OTHER): Payer: Self-pay

## 2024-04-13 ENCOUNTER — Ambulatory Visit
Admission: EM | Admit: 2024-04-13 | Discharge: 2024-04-13 | Disposition: A | Discharge status: Home / Self Care | Payer: Self-pay | Attending: Emergency Medicine | Admitting: Emergency Medicine

## 2024-04-13 DIAGNOSIS — R0602 Shortness of breath: Secondary | ICD-10-CM

## 2024-04-13 DIAGNOSIS — Z85828 Personal history of other malignant neoplasm of skin: Secondary | ICD-10-CM | POA: Insufficient documentation

## 2024-04-13 DIAGNOSIS — R400 Somnolence: Secondary | ICD-10-CM | POA: Insufficient documentation

## 2024-04-13 DIAGNOSIS — K219 Gastro-esophageal reflux disease without esophagitis: Secondary | ICD-10-CM | POA: Insufficient documentation

## 2024-04-13 DIAGNOSIS — Z6841 Body Mass Index (BMI) 40.0 and over, adult: Secondary | ICD-10-CM | POA: Insufficient documentation

## 2024-04-13 DIAGNOSIS — I509 Heart failure, unspecified: Secondary | ICD-10-CM | POA: Insufficient documentation

## 2024-04-13 DIAGNOSIS — R0683 Snoring: Secondary | ICD-10-CM | POA: Insufficient documentation

## 2024-04-13 DIAGNOSIS — K589 Irritable bowel syndrome without diarrhea: Secondary | ICD-10-CM | POA: Insufficient documentation

## 2024-04-13 DIAGNOSIS — F17210 Nicotine dependence, cigarettes, uncomplicated: Secondary | ICD-10-CM | POA: Insufficient documentation

## 2024-04-13 LAB — BRAIN NATRIURETIC PEPTIDE: B Natriuretic Peptide: 128.5 pg/mL — ABNORMAL HIGH (ref 0.0–100.0)

## 2024-04-13 LAB — CBC WITH DIFFERENTIAL/PLATELET
Abs Immature Granulocytes: 0.03 K/uL (ref 0.00–0.07)
Basophils Absolute: 0 K/uL (ref 0.0–0.1)
Basophils Relative: 0 %
Eosinophils Absolute: 0.2 K/uL (ref 0.0–0.5)
Eosinophils Relative: 2 %
HCT: 31.8 % — ABNORMAL LOW (ref 36.0–46.0)
Hemoglobin: 9.5 g/dL — ABNORMAL LOW (ref 12.0–15.0)
Immature Granulocytes: 0 %
Lymphocytes Relative: 20 %
Lymphs Abs: 1.8 K/uL (ref 0.7–4.0)
MCH: 21.2 pg — ABNORMAL LOW (ref 26.0–34.0)
MCHC: 29.9 g/dL — ABNORMAL LOW (ref 30.0–36.0)
MCV: 71 fL — ABNORMAL LOW (ref 80.0–100.0)
Monocytes Absolute: 0.8 K/uL (ref 0.1–1.0)
Monocytes Relative: 9 %
Neutro Abs: 6.1 K/uL (ref 1.7–7.7)
Neutrophils Relative %: 69 %
Platelets: 315 K/uL (ref 150–400)
RBC: 4.48 MIL/uL (ref 3.87–5.11)
RDW: 17.1 % — ABNORMAL HIGH (ref 11.5–15.5)
WBC: 8.8 K/uL (ref 4.0–10.5)
nRBC: 0 % (ref 0.0–0.2)

## 2024-04-13 LAB — COMPREHENSIVE METABOLIC PANEL WITH GFR
ALT: 15 U/L (ref 0–44)
AST: 17 U/L (ref 15–41)
Albumin: 3.9 g/dL (ref 3.5–5.0)
Alkaline Phosphatase: 83 U/L (ref 38–126)
Anion gap: 10 (ref 5–15)
BUN: 13 mg/dL (ref 6–20)
CO2: 23 mmol/L (ref 22–32)
Calcium: 9 mg/dL (ref 8.9–10.3)
Chloride: 105 mmol/L (ref 98–111)
Creatinine, Ser: 0.66 mg/dL (ref 0.44–1.00)
GFR, Estimated: >60 mL/min (ref >60)
Glucose, Bld: 76 mg/dL (ref 70–99)
Potassium: 3.8 mmol/L (ref 3.5–5.1)
Sodium: 138 mmol/L (ref 135–145)
Total Bilirubin: 0.8 mg/dL (ref 0.0–1.2)
Total Protein: 7.4 g/dL (ref 6.5–8.1)

## 2024-04-13 MED ORDER — FUROSEMIDE 40 MG PO TABS
20.0000 mg | ORAL_TABLET | Freq: Every day | ORAL | 0 refills | Status: AC
Start: 2024-04-13 — End: ?

## 2024-04-13 MED ORDER — AEROCHAMBER MV MISC: 2 refills | Status: AC

## 2024-04-13 MED ORDER — POTASSIUM CHLORIDE CRYS ER 20 MEQ PO TBCR
20.0000 meq | EXTENDED_RELEASE_TABLET | Freq: Every day | ORAL | 0 refills | Status: AC
Start: 2024-04-13 — End: ?

## 2024-04-13 MED ORDER — ALBUTEROL SULFATE HFA 108 (90 BASE) MCG/ACT IN AERS: 2.0000 | INHALATION_SPRAY | RESPIRATORY_TRACT | 0 refills | Status: AC | PRN

## 2024-04-13 NOTE — ED Triage Notes (Signed)
 Pt c/o SOB x2-75months  Pt states that it woke her up from sleep last night at 2am and she was unable to catch her breath  Pt is worried about Sleep Apnea.   Pt has surgery for skin cancer next week

## 2024-04-13 NOTE — ED Provider Notes (Signed)
 [facility]    CSN: [id number] Arrival date & time: 04/13/24  1238      History   Chief Complaint Chief Complaint  Patient presents with   Shortness of Breath    HPI Robin Brooks is a 63 y.o. female.   HPI  63 year old female with past medical history significant for ductal carcinoma in situ of the left breast, allergic rhinitis, GERD, IBS, IDA, morbid obesity, and skin cancer presents for evaluation of shortness of breath that has been going on for last 2 to 3 months.  She denies any chest pain or palpitation.  She will occasionally have sweating.  She endorses daytime sleepiness and snoring.  Patient is an ex-smoker and she smoked for 37-1/2 years.  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, High-grade, comedo necrosis ER/PR positive   Colitis    gastric   Dyspnea    GERD (gastroesophageal reflux disease)    Heart murmur    ASYMPTOMATIC   History of methicillin resistant staphylococcus aureus (MRSA) 2013   IBS (irritable bowel syndrome)    Personal history of radiation therapy 2018   Left breast cancer, mammosite   Pneumonia 2003   H/O    Patient Active Problem List   Diagnosis Date Noted   Left hand pain 11/01/2018   Numbness and tingling in left hand 11/01/2018   Morbid obesity with BMI of 45.0-49.9, adult (HCC) 09/20/2018   Ductal carcinoma in situ (DCIS) of left breast 03/11/2017   IDA (iron deficiency anemia) 02/27/2014   Allergic rhinitis 01/30/2014   GERD (gastroesophageal reflux disease) 01/30/2014   IBS (irritable bowel syndrome) 01/30/2014    Past Surgical History:  Procedure Laterality Date   BIOPSY  05/17/2023   Procedure: BIOPSY;  Surgeon: Maryruth Ole DASEN, MD;  Location: [facility];  Service: Endoscopy;;   BREAST BIOPSY Left 03/02/2017   DCIS   BREAST CYST EXCISION Left 80s   neg   BREAST LUMPECTOMY Left 04/19/2017   Wide excision high grade  DCIS, ER/PR positive.   Surgeon: Dessa Reyes ORN, MD;  Location: [facility];  Service: General;  Laterality: Left;   BREAST MAMMOSITE Left 05/12/2017   Procedure: MAMMOSITE BREAST;  Surgeon: Dessa Reyes ORN, MD;  Location: [facility];  Service: General;  Laterality: Left;   CARPAL TUNNEL RELEASE Left 05/17/2019   Procedure: CARPAL TUNNEL RELEASE ENDOSCOPIC;  Surgeon: Edie Norleen PARAS, MD;  Location: [facility];  Service: Orthopedics;  Laterality: Left;   CESAREAN SECTION     CHOLECYSTECTOMY     COLONOSCOPY W/ POLYPECTOMY     COLONOSCOPY WITH ESOPHAGOGASTRODUODENOSCOPY (EGD)     COLONOSCOPY WITH PROPOFOL  N/A 05/17/2023   Procedure: COLONOSCOPY WITH PROPOFOL ;  Surgeon: Maryruth Ole DASEN, MD;  Location: [facility];  Service: Endoscopy;  Laterality: N/A;   ESOPHAGOGASTRODUODENOSCOPY (EGD) WITH PROPOFOL  N/A 05/17/2023   Procedure: ESOPHAGOGASTRODUODENOSCOPY (EGD) WITH PROPOFOL ;  Surgeon: Maryruth Ole DASEN, MD;  Location: [facility];  Service: Endoscopy;  Laterality: N/A;   FOOT SURGERY     left   POLYPECTOMY  05/17/2023   Procedure: POLYPECTOMY;  Surgeon: Maryruth Ole DASEN, MD;  Location: [facility];  Service: Endoscopy;;   TUBAL LIGATION  1995    OB History     Gravida  1   Para      Term      Preterm  AB      Living  1      SAB      IAB      Ectopic      Multiple      Live Births  1        Obstetric Comments  1st Menstrual Cycle:  12 1st Pregnancy: 30           Home Medications    Prior to Admission medications   Medication Sig Start Date End Date Taking? Authorizing Provider  albuterol  (VENTOLIN  HFA) 108 (90 Base) MCG/ACT inhaler Inhale 2 puffs into the lungs every 4 (four) hours as needed. 04/13/24  Yes Bernardino Ditch, NP  cetirizine (ZYRTEC) 10 MG tablet Take 10 mg by mouth as needed for allergies.   Yes [provider]  FLUoxetine (PROZAC) 20 MG capsule Take 20 mg by mouth daily before breakfast.   Yes [provider]   furosemide  (LASIX ) 40 MG tablet Take 0.5 tablets (20 mg total) by mouth daily. 04/13/24  Yes Bernardino Ditch, NP  ibuprofen (ADVIL) 200 MG tablet Take 200 mg by mouth in the morning and at bedtime.   Yes [provider]  loperamide (IMODIUM A-D) 2 MG tablet Take by mouth.   Yes [provider]  omeprazole (PRILOSEC) 20 MG capsule Take 20 mg by mouth daily before breakfast.    Yes [provider]  potassium chloride  SA (KLOR-CON  M) 20 MEQ tablet Take 1 tablet (20 mEq total) by mouth daily. 04/13/24  Yes Bernardino Ditch, NP  pregabalin (LYRICA) 50 MG capsule PREGABALIN 50 MG CAPS 02/21/24  Yes [provider]  Spacer/Aero-Holding Chambers (AEROCHAMBER MV) inhaler Use as instructed 04/13/24  Yes Bernardino Ditch, NP    Family History Family History  Problem Relation Age of Onset   Other Father    Breast cancer Paternal Aunt     Social History Social History   Tobacco Use   Smoking status: Former    Current packs/day: 0.00    Average packs/day: 0.3 packs/day for 35.0 years (8.8 ttl pk-yrs)    Types: Cigarettes    Start date: 05/18/1982    Quit date: 05/18/2017    Years since quitting: 6.9   Smokeless tobacco: Never  Vaping Use   Vaping status: Never Used  Substance Use Topics   Alcohol use: No   Drug use: No     Allergies   Coriandrum sativum   Review of Systems Review of Systems  Respiratory:  Positive for shortness of breath. Negative for cough and wheezing.   Cardiovascular:  Negative for chest pain, palpitations and leg swelling.     Physical Exam Triage Vital Signs ED Triage Vitals  Encounter Vitals Group     BP      Girls Systolic BP Percentile      Girls Diastolic BP Percentile      Boys Systolic BP Percentile      Boys Diastolic BP Percentile      Pulse      Resp      Temp      Temp src      SpO2      Weight      Height      Head Circumference      Peak Flow      Pain Score      Pain Loc      Pain Education      Exclude  from Growth Chart    No data found.  Updated Vital Signs BP (!) 144/75 (BP Location: Left Arm)   Pulse 69   Temp 98.8 F (37.1 C) (Oral)   Ht 5' (1.524 m)   Wt 270 lb (122.5 kg)   LMP 06/20/2015 (Approximate)   SpO2 95%   BMI 52.73 kg/m   Visual Acuity Right Eye Distance:   Left Eye Distance:   Bilateral Distance:    Right Eye Near:   Left Eye Near:    Bilateral Near:     Physical Exam Vitals and nursing note reviewed.  Constitutional:      Appearance: Normal appearance. She is not ill-appearing.  HENT:     Head: Normocephalic and atraumatic.  Cardiovascular:     Rate and Rhythm: Normal rate and regular rhythm.     Pulses: Normal pulses.     Heart sounds: Normal heart sounds. No murmur heard.    No friction rub. No gallop.  Pulmonary:     Effort: Pulmonary effort is normal.     Breath sounds: Normal breath sounds. No wheezing, rhonchi or rales.  Skin:    General: Skin is warm and dry.     Capillary Refill: Capillary refill takes less than 2 seconds.     Findings: No rash.  Neurological:     General: No focal deficit present.     Mental Status: She is alert and oriented to person, place, and time.      UC Treatments / Results  Labs (all labs ordered are listed, but only abnormal results are displayed) Labs Reviewed  CBC WITH DIFFERENTIAL/PLATELET - Abnormal; Notable for the following components:      Result Value   Hemoglobin 9.5 (*)    HCT 31.8 (*)    MCV 71.0 (*)    MCH 21.2 (*)    MCHC 29.9 (*)    RDW 17.1 (*)    All other components within normal limits  COMPREHENSIVE METABOLIC PANEL WITH GFR  CBC WITH DIFFERENTIAL/PLATELET  BRAIN NATRIURETIC PEPTIDE    EKG Normal sinus rhythm with a ventricular rate of 65 bpm PR interval 160 ms QRS duration 86 ms QT/QTc 416/432 ms No ST or T wave abnormalities noted.  Radiology DG Chest 2 View Result Date: 04/13/2024 CLINICAL DATA:  Intermittent shortness of breath x 2 to 91-month. History of smoking for  37.5 years. EXAM: CHEST - 2 VIEW COMPARISON:  08/28/2022. FINDINGS: There are diffuse increased alveolar and interstitial opacities throughout bilateral lungs. Findings are nonspecific. Differential diagnosis includes pulmonary edema versus atypical pneumonia. Correlate clinically. Bilateral costophrenic angles are clear. No pleural effusion or pneumothorax. Mildly enlarged cardio-mediastinal silhouette. No acute osseous abnormalities. The soft tissues are within normal limits. IMPRESSION: *Diffuse increased alveolar and interstitial opacities throughout bilateral lungs. Differential diagnosis includes pulmonary edema versus atypical pneumonia. Electronically Signed   By: Ree Molt M.D.   On: 04/13/2024 13:36    Procedures Procedures (including critical care time)  Medications Ordered in UC Medications - No data to display  Initial Impression / Assessment and Plan / UC Course  I have reviewed the triage vital signs and the nursing notes.  Pertinent labs & imaging results that were available during my care of the patient were reviewed by me and considered in my medical decision making (see chart for details).   Patient is a pleasant, nontoxic-appearing 63 year old female presenting for evaluation of intermittent shortness of breath.  She reports that she is currently at the heaviest weight she has ever been and that this summer she has had  significant issues with shortness of breath.  It is periodic and not all the time.  Not necessarily associated with activity.  2 nights ago it awoke her in the middle the night because she was short of breath and unable to catch her breath.  She has a primary care provider at [facility].  She was recently diagnosed with skin cancer on her right anterior shin and is not due to be excised in the next few weeks.  Since she learned that she has skin cancer she reports that she has started back smoking.  She about 1 pack of cigarettes and it is taken her 3 weeks  and she has not completed the pack.  She did previously smoke for a long period of time.  She also endorses daytime sleepiness and snoring.  In the exam room she is not in any acute distress and she is able to speak in full sentence without dyspnea or tachypnea.  Room air oxygen saturation is 95%.  She does have a history of a heart murmur and her PCP has referred her to cardiology.  However, I am unable to appreciate the murmur on auscultation.  Her lungs are clear to auscultation all fields.  I suspect that her shortness of breath may be commendation of COPD and also sleep apnea.  She does need a sleep study and I have advised her to speak to her primary care provider regarding a sleep study.  I will give her information about home sleep study that she can provide to her provider.  I will also obtain EKG to evaluate for any cardiac cause that could be contributing to her shortness of breath and a chest x-ray to evaluate for any acute cardiopulmonary abnormality.  Radiology impression states diffuse increased alveolar and interstitial opacities throughout bilateral lungs.  Differential diagnose include pulmonary edema versus atypical pneumonia.  I will order a CBC, CMP, and BNP.  Patient has no peripheral edema on exam.  I discussed the finding of the chest x-ray with the patient and she indicated that her maternal grandmother, mother, and maternal aunts all died of congestive heart failure.  All were smokers with the exception of her maternal grandmother.  Patient's EKG shows normal sinus rhythm without any ST or T wave abnormalities.  No appreciable change when compared to EKG dated 05/12/2019.  CBC shows anemia with a H&H of 9.5 and 31.8.  MCV is also low at 71, MCH 21.2, MCHC 29.9, RDW 17.1.  Patient WBC count is normal at 8.8 as are platelets at 315.  CMP shows normal electrolytes, renal function, and transaminases.  Review of records in epic show patient had a colonoscopy in September of 2024  which showed 2 polyps in the transverse colon that were removed with cold snare.  Internal hemorrhoids were found on retroflexion that were determined to be grade 1 and a few small mouth diverticula were found in the sigmoid colon.  The colonoscopy was otherwise unremarkable.  Patient also had an endoscopy at that time that showed small hiatal hernia with a single sessile polyp in the gastric fundus.  Otherwise unremarkable.  I will have patient reach out to [facility] cardiology in [facility] where she was referred for further workup and evaluation for possible new onset congestive heart failure.  I will discharge her home on a short course of Lasix  and have her follow-up with her primary care provider next week.  I will also prescribe an albuterol  inhaler and spacer and she can take 1  to 2 puffs every 4 6 hours as needed for shortness of breath or wheezing.   Final Clinical Impressions(s) / UC Diagnoses   Final diagnoses:  Shortness of breath  Acute congestive heart failure, unspecified heart failure type [facility])     Discharge Instructions      As we discussed, I feels you need a sleep study to determine if you have sleep apnea.  There is a company called [employer].  Their website is: [email or web address].com/you can speak to your primary care provider about ordering this test.    Your chest x-ray shows that you have an enlarged heart and has pulmonary edema which could be contributing to your shortness of breath.  Please take 20 mg of Lasix  daily to help get rid of the excess fluid which should help you with your shortness of breath.  Take this in the morning, do not take it at night.  With the Lasix  and when you take 20 mg equivalents of potassium as Lasix  can did not bleed your potassium and we wanted keep your potassium in a normal level.  Use the albuterol  inhaler, with the spacer, and take 1 to 2 puffs every 4-6 hours as needed for shortness of breath or wheezing.  Call [facility]  cardiology at [facility] tomorrow to inquire about the referral that was made by your primary care provider.  See if you get an appointment soon for evaluation given the new findings of your chest x-ray.  I would also contact your primary care provider tomorrow and let her know the findings from your visit today at urgent care.  Your blood work also shows that your anemia is slightly worse than it was in April, this too could be contributing to your shortness of breath.  If you develop any increasing shortness of breath, chest pain, palpitations, leg swelling, dizziness, or fainting you need to call 911 and go to the ER.     ED Prescriptions     Medication Sig Dispense Auth. Provider   Spacer/Aero-Holding Chambers (AEROCHAMBER MV) inhaler Use as instructed 1 each Bernardino Ditch, NP   albuterol  (VENTOLIN  HFA) 108 (90 Base) MCG/ACT inhaler Inhale 2 puffs into the lungs every 4 (four) hours as needed. 18 g Bernardino Ditch, NP   furosemide  (LASIX ) 40 MG tablet Take 0.5 tablets (20 mg total) by mouth daily. 3 tablet Bernardino Ditch, NP   potassium chloride  SA (KLOR-CON  M) 20 MEQ tablet Take 1 tablet (20 mEq total) by mouth daily. 3 tablet Bernardino Ditch, NP      PDMP not reviewed this encounter.   Bernardino Ditch, NP 04/13/24 1526

## 2024-04-13 NOTE — Discharge Instructions (Addendum)
 As we discussed, I feels you need a sleep study to determine if you have sleep apnea.  There is a company called [employer].  Their website is: [email or web address].com/you can speak to your primary care provider about ordering this test.    Your chest x-ray shows that you have an enlarged heart and has pulmonary edema which could be contributing to your shortness of breath.  Please take 20 mg of Lasix  daily to help get rid of the excess fluid which should help you with your shortness of breath.  Take this in the morning, do not take it at night.  With the Lasix  and when you take 20 mg equivalents of potassium as Lasix  can did not bleed your potassium and we wanted keep your potassium in a normal level.  Use the albuterol  inhaler, with the spacer, and take 1 to 2 puffs every 4-6 hours as needed for shortness of breath or wheezing.  Call [facility] cardiology at [facility] tomorrow to inquire about the referral that was made by your primary care provider.  See if you get an appointment soon for evaluation given the new findings of your chest x-ray.  I would also contact your primary care provider tomorrow and let her know the findings from your visit today at urgent care.  Your blood work also shows that your anemia is slightly worse than it was in April, this too could be contributing to your shortness of breath.  If you develop any increasing shortness of breath, chest pain, palpitations, leg swelling, dizziness, or fainting you need to call 911 and go to the ER.

## 2024-04-14 ENCOUNTER — Ambulatory Visit: Payer: Self-pay
